# Patient Record
Sex: Female | Born: 2000 | Race: White | Hispanic: No | Marital: Single | Smoking: Never smoker
Health system: Southern US, Community
[De-identification: ages and names within clinical notes are randomized; demographics above are authoritative.]

## PROBLEM LIST (undated history)

## (undated) DIAGNOSIS — N809 Endometriosis, unspecified: Secondary | ICD-10-CM

## (undated) DIAGNOSIS — G43909 Migraine, unspecified, not intractable, without status migrainosus: Secondary | ICD-10-CM

## (undated) DIAGNOSIS — T7840XA Allergy, unspecified, initial encounter: Secondary | ICD-10-CM

## (undated) DIAGNOSIS — B009 Herpesviral infection, unspecified: Secondary | ICD-10-CM

## (undated) DIAGNOSIS — K219 Gastro-esophageal reflux disease without esophagitis: Secondary | ICD-10-CM

## (undated) DIAGNOSIS — E7800 Pure hypercholesterolemia, unspecified: Secondary | ICD-10-CM

## (undated) DIAGNOSIS — G901 Familial dysautonomia [Riley-Day]: Secondary | ICD-10-CM

## (undated) DIAGNOSIS — G8929 Other chronic pain: Secondary | ICD-10-CM

## (undated) HISTORY — DX: Migraine, unspecified, not intractable, without status migrainosus: G43.909

## (undated) HISTORY — DX: Familial dysautonomia (riley-day): G90.1

## (undated) HISTORY — DX: Endometriosis, unspecified: N80.9

## (undated) HISTORY — DX: Other chronic pain: G89.29

## (undated) HISTORY — DX: Pure hypercholesterolemia, unspecified: E78.00

## (undated) HISTORY — PX: OTHER SURGICAL HISTORY: SHX169

## (undated) HISTORY — DX: Allergy, unspecified, initial encounter: T78.40XA

## (undated) HISTORY — DX: Herpesviral infection, unspecified: B00.9

## (undated) NOTE — *Deleted (*Deleted)
HEMATOLOGY/ONCOLOGY CONSULTATION NOTE  Date of Service: 01/19/2020  Patient Care Team: Maryellen Pile, MD as PCP - General (Pediatrics)  CHIEF COMPLAINTS/PURPOSE OF CONSULTATION:  IDA  HISTORY OF PRESENTING ILLNESS:  Cassandra Ramos is a wonderful 83 y.o. female who has been referred to [place] by Dr. Nadene Rubins for evaluation and management of iron deficiency anemia. Pt is accompanied today by ***. The pt reports that she is doing well overall.   The pt reports ***   Of note prior to the patient's visit today, pt has had *** completed on *** with results revealing ***.   Most recent lab results (***) of CBC is as follows: all values are WNL except for ***.  On review of systems, pt reports *** and denies *** and any other symptoms.   On PMHx the pt reports ***. On Social Hx the pt reports *** On Family Hx the pt reports ***  A: -Discussed patient's most recent labs from ***, *** -***   MEDICAL HISTORY:  Past Medical History:  Diagnosis Date  . Allergy    grass and tree pollen  . Chronic pain   . Dysautonomia (HCC)   . Endometriosis    suspected based on symptoms, runs in family  . GERD (gastroesophageal reflux disease)   . High cholesterol   . Migraine     SURGICAL HISTORY: Past Surgical History:  Procedure Laterality Date  . adnoidectomy      SOCIAL HISTORY: Social History   Socioeconomic History  . Marital status: Single    Spouse name: Not on file  . Number of children: Not on file  . Years of education: Not on file  . Highest education level: High school graduate  Occupational History  . Not on file  Tobacco Use  . Smoking status: Never Smoker  . Smokeless tobacco: Never Used  Vaping Use  . Vaping Use: Never used  Substance and Sexual Activity  . Alcohol use: Never  . Drug use: Never  . Sexual activity: Yes    Birth control/protection: Pill, Condom  Other Topics Concern  . Not on file  Social History Narrative   LIves at home with mom, dad,  sister, brother   Right handed   Works at [employer]   Caffeine: large coke every day   Social Determinants of Corporate investment banker Strain:   . Difficulty of Paying Living Expenses: Not on file  Food Insecurity:   . Worried About Programme researcher, broadcasting/film/video in the Last Year: Not on file  . Ran Out of Food in the Last Year: Not on file  Transportation Needs:   . Lack of Transportation (Medical): Not on file  . Lack of Transportation (Non-Medical): Not on file  Physical Activity:   . Days of Exercise per Week: Not on file  . Minutes of Exercise per Session: Not on file  Stress:   . Feeling of Stress : Not on file  Social Connections:   . Frequency of Communication with Friends and Family: Not on file  . Frequency of Social Gatherings with Friends and Family: Not on file  . Attends Religious Services: Not on file  . Active Member of Clubs or Organizations: Not on file  . Attends Banker Meetings: Not on file  . Marital Status: Not on file  Intimate Partner Violence:   . Fear of Current or Ex-Partner: Not on file  . Emotionally Abused: Not on file  . Physically Abused: Not on file  .  Sexually Abused: Not on file    FAMILY HISTORY: Family History  Problem Relation Age of Onset  . Asthma Mother   . Endometriosis Mother   . Migraines Mother   . Hypertension Maternal Aunt   . Diabetes Maternal Aunt   . High Cholesterol Maternal Aunt   . Asthma Maternal Aunt   . Asthma Maternal Grandmother   . Hypertension Maternal Grandmother   . High Cholesterol Maternal Grandmother   . Heart disease Maternal Grandmother   . Cancer Maternal Grandmother        breast  . Lung cancer Paternal Grandmother     ALLERGIES:  is allergic to other.  MEDICATIONS:  Current Outpatient Medications  Medication Sig Dispense Refill  . DULoxetine (CYMBALTA) 60 MG capsule Take 60 mg by mouth daily.    . Fremanezumab-vfrm (AJOVY) 225 MG/1.5ML SOSY Inject 225 mg into the skin every 30  (thirty) days. 1.5 mL 11  . SPRINTEC 28 0.25-35 MG-MCG tablet Take 1 tablet by mouth daily.    . traZODone (DESYREL) 50 MG tablet Take 100 mg by mouth at bedtime.    Marland Kitchen Ubrogepant 100 MG TABS Take 100 mg by mouth every 2 (two) hours as needed. For migraine. Maximum 200mg (2 tablets) in one day. 10 tablet 6   No current facility-administered medications for this visit.    REVIEW OF SYSTEMS:    10 Point review of Systems was done is negative except as noted above.  PHYSICAL EXAMINATION: ECOG PERFORMANCE STATUS: {CHL ONC ECOG LK:[id number]}  .There were no vitals filed for this visit. There were no vitals filed for this visit. .There is no height or weight on file to calculate BMI.  *** GENERAL:alert, in no acute distress and comfortable SKIN: no acute rashes, no significant lesions EYES: conjunctiva are pink and non-injected, sclera anicteric OROPHARYNX: MMM, no exudates, no oropharyngeal erythema or ulceration NECK: supple, no JVD LYMPH:  no palpable lymphadenopathy in the cervical, axillary or inguinal regions LUNGS: clear to auscultation b/l with normal respiratory effort HEART: regular rate & rhythm ABDOMEN:  normoactive bowel sounds , non tender, not distended. Extremity: no pedal edema PSYCH: alert & oriented x 3 with fluent speech NEURO: no focal motor/sensory deficits  LABORATORY DATA:  I have reviewed the data as listed  . CBC Latest Ref Rng & Units 09/11/2018  WBC 3.4 - 10.8 x10E3/uL 8.3  Hemoglobin 11.1 - 15.9 g/dL 66.4  Hematocrit 40.3 - 46.6 % 37.1  Platelets 150 - 450 x10E3/uL 300    . CMP Latest Ref Rng & Units 09/11/2018  Glucose 65 - 99 mg/dL 97  BUN 6 - 20 mg/dL 11  Creatinine 4.74 - 2.59 mg/dL 5.63  Sodium 875 - 643 mmol/L 143  Potassium 3.5 - 5.2 mmol/L 5.3(H)  Chloride 96 - 106 mmol/L 106  CO2 20 - 29 mmol/L 20  Calcium 8.7 - 10.2 mg/dL 9.6  Total Protein 6.0 - 8.5 g/dL 6.8  Total Bilirubin 0.0 - 1.2 mg/dL <3.2  Alkaline Phos 43 - 101 IU/L 87  AST  0 - 40 IU/L 15  ALT 0 - 32 IU/L 13     RADIOGRAPHIC STUDIES: I have personally reviewed the radiological images as listed and agreed with the findings in the report. No results found.  ASSESSMENT & PLAN:   PLAN: ***   FOLLOW UP: ***  All of the patients questions were answered with apparent satisfaction. The patient knows to call the clinic with any problems, questions or concerns.  I spent ***  counseling the patient face to face. The total time spent in the appointment was *** and more than 50% was on counseling and direct patient cares.    Wyvonnia Lora MD MS AAHIVMS [facility] Hematology/Oncology Physician [place]  (Office):       [phone] (Work cell):  [phone] (Fax):           [phone]  01/19/2020 8:06 AM  I, Carollee Herter, am acting as a scribe for Dr. Wyvonnia Lora.   {Add Production assistant, radio Statement}

---
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
## 2020-01-06 ENCOUNTER — Telehealth: Payer: Self-pay | Admitting: Hematology

## 2020-01-06 NOTE — Telephone Encounter (Signed)
Received a new hem referral from Dr. Nadene Rubins at [facility] for IDA. Pt has been cld and scheduled to see Dr. Candise Che on 11/8 at 1pm. Appt date and time has been given to the pt's mom. Aware to arrive 30 minutes early.

## 2020-01-19 ENCOUNTER — Other Ambulatory Visit: Payer: Self-pay | Admitting: Gastroenterology

## 2020-01-19 ENCOUNTER — Inpatient Hospital Stay: Payer: BC Managed Care – PPO | Admitting: Hematology

## 2020-01-19 ENCOUNTER — Other Ambulatory Visit: Payer: Self-pay

## 2020-01-19 ENCOUNTER — Telehealth: Payer: Self-pay | Admitting: Hematology

## 2020-01-19 DIAGNOSIS — D509 Iron deficiency anemia, unspecified: Secondary | ICD-10-CM

## 2020-01-19 DIAGNOSIS — E569 Vitamin deficiency, unspecified: Secondary | ICD-10-CM

## 2020-01-19 NOTE — Telephone Encounter (Signed)
Rescheduled per 11/08 appointment to 11/16 per patient's request.

## 2020-01-23 ENCOUNTER — Ambulatory Visit
Admission: RE | Admit: 2020-01-23 | Discharge: 2020-01-23 | Disposition: A | Discharge status: Home / Self Care | Payer: BC Managed Care – PPO | Source: Ambulatory Visit | Attending: Gastroenterology | Admitting: Gastroenterology

## 2020-01-23 DIAGNOSIS — D509 Iron deficiency anemia, unspecified: Secondary | ICD-10-CM

## 2020-01-23 DIAGNOSIS — E569 Vitamin deficiency, unspecified: Secondary | ICD-10-CM

## 2020-01-27 ENCOUNTER — Encounter: Payer: BC Managed Care – PPO | Admitting: Hematology

## 2020-02-02 ENCOUNTER — Inpatient Hospital Stay: Payer: BC Managed Care – PPO

## 2020-02-02 ENCOUNTER — Inpatient Hospital Stay: Payer: BC Managed Care – PPO | Attending: Hematology | Admitting: Hematology

## 2020-02-02 ENCOUNTER — Other Ambulatory Visit: Payer: Self-pay

## 2020-02-02 VITALS — BP 102/51 | HR 90 | Temp 98.0°F | Resp 18 | Ht 64.0 in | Wt 201.5 lb

## 2020-02-02 DIAGNOSIS — E538 Deficiency of other specified B group vitamins: Secondary | ICD-10-CM

## 2020-02-02 DIAGNOSIS — Z842 Family history of other diseases of the genitourinary system: Secondary | ICD-10-CM | POA: Diagnosis not present

## 2020-02-02 DIAGNOSIS — E638 Other specified nutritional deficiencies: Secondary | ICD-10-CM | POA: Insufficient documentation

## 2020-02-02 DIAGNOSIS — K219 Gastro-esophageal reflux disease without esophagitis: Secondary | ICD-10-CM | POA: Insufficient documentation

## 2020-02-02 DIAGNOSIS — Z803 Family history of malignant neoplasm of breast: Secondary | ICD-10-CM | POA: Insufficient documentation

## 2020-02-02 DIAGNOSIS — Z801 Family history of malignant neoplasm of trachea, bronchus and lung: Secondary | ICD-10-CM | POA: Insufficient documentation

## 2020-02-02 DIAGNOSIS — E559 Vitamin D deficiency, unspecified: Secondary | ICD-10-CM | POA: Insufficient documentation

## 2020-02-02 DIAGNOSIS — G43909 Migraine, unspecified, not intractable, without status migrainosus: Secondary | ICD-10-CM | POA: Diagnosis not present

## 2020-02-02 DIAGNOSIS — Z8349 Family history of other endocrine, nutritional and metabolic diseases: Secondary | ICD-10-CM | POA: Diagnosis not present

## 2020-02-02 DIAGNOSIS — Z79899 Other long term (current) drug therapy: Secondary | ICD-10-CM | POA: Diagnosis not present

## 2020-02-02 DIAGNOSIS — D509 Iron deficiency anemia, unspecified: Secondary | ICD-10-CM | POA: Diagnosis present

## 2020-02-02 DIAGNOSIS — K580 Irritable bowel syndrome with diarrhea: Secondary | ICD-10-CM | POA: Diagnosis not present

## 2020-02-02 DIAGNOSIS — Z793 Long term (current) use of hormonal contraceptives: Secondary | ICD-10-CM | POA: Diagnosis not present

## 2020-02-02 LAB — FERRITIN: Ferritin: 7 ng/mL — ABNORMAL LOW (ref 11–307)

## 2020-02-02 LAB — CMP (CANCER CENTER ONLY)
ALT: 16 U/L (ref 0–44)
AST: 13 U/L — ABNORMAL LOW (ref 15–41)
Albumin: 3.8 g/dL (ref 3.5–5.0)
Alkaline Phosphatase: 97 U/L (ref 38–126)
Anion gap: 10 (ref 5–15)
BUN: 9 mg/dL (ref 6–20)
CO2: 25 mmol/L (ref 22–32)
Calcium: 9.6 mg/dL (ref 8.9–10.3)
Chloride: 102 mmol/L (ref 98–111)
Creatinine: 1.01 mg/dL — ABNORMAL HIGH (ref 0.44–1.00)
GFR, Estimated: >60 mL/min (ref >60)
Glucose, Bld: 100 mg/dL — ABNORMAL HIGH (ref 70–99)
Potassium: 3.9 mmol/L (ref 3.5–5.1)
Sodium: 137 mmol/L (ref 135–145)
Total Bilirubin: 0.4 mg/dL (ref 0.3–1.2)
Total Protein: 8.2 g/dL — ABNORMAL HIGH (ref 6.5–8.1)

## 2020-02-02 LAB — CBC WITH DIFFERENTIAL/PLATELET
Abs Immature Granulocytes: 0.04 10*3/uL (ref 0.00–0.07)
Basophils Absolute: 0 10*3/uL (ref 0.0–0.1)
Basophils Relative: 0 %
Eosinophils Absolute: 0 10*3/uL (ref 0.0–0.5)
Eosinophils Relative: 0 %
HCT: 41.3 % (ref 36.0–46.0)
Hemoglobin: 12.8 g/dL (ref 12.0–15.0)
Immature Granulocytes: 0 %
Lymphocytes Relative: 26 %
Lymphs Abs: 2.9 10*3/uL (ref 0.7–4.0)
MCH: 25.3 pg — ABNORMAL LOW (ref 26.0–34.0)
MCHC: 31 g/dL (ref 30.0–36.0)
MCV: 81.6 fL (ref 80.0–100.0)
Monocytes Absolute: 0.6 10*3/uL (ref 0.1–1.0)
Monocytes Relative: 6 %
Neutro Abs: 7.3 10*3/uL (ref 1.7–7.7)
Neutrophils Relative %: 68 %
Platelets: 339 10*3/uL (ref 150–400)
RBC: 5.06 MIL/uL (ref 3.87–5.11)
RDW: 13.7 % (ref 11.5–15.5)
WBC: 10.9 10*3/uL — ABNORMAL HIGH (ref 4.0–10.5)
nRBC: 0 % (ref 0.0–0.2)

## 2020-02-02 LAB — IRON AND TIBC
Iron: 31 ug/dL — ABNORMAL LOW (ref 41–142)
Saturation Ratios: 6 % — ABNORMAL LOW (ref 21–57)
TIBC: 514 ug/dL — ABNORMAL HIGH (ref 236–444)
UIBC: 483 ug/dL — ABNORMAL HIGH (ref 120–384)

## 2020-02-02 LAB — VITAMIN B12: Vitamin B-12: 730 pg/mL (ref 180–914)

## 2020-02-02 NOTE — Progress Notes (Signed)
HEMATOLOGY/ONCOLOGY CONSULTATION NOTE  Date of Service: 02/02/2020  Patient Care Team: Maryellen Pile, MD as PCP - General (Pediatrics)  CHIEF COMPLAINTS/PURPOSE OF CONSULTATION:  IDA  HISTORY OF PRESENTING ILLNESS:   Cassandra Ramos is a wonderful 19 y.o. female who has been referred to [place] by Dr. Dorinda Hill for evaluation and management of iron deficiency anemia. The pt reports that she is doing well overall.   The pt reports that she was previously Vitamin B12 deficient (@ 203). She is currently receiving monthly Vitamin B12 injections and her B12 levels have normalized. Pt was also discovered to be Vitamin D and Iron deficient by her OBGYN. She was placed on Vitamin D and OTC PO Iron a few months ago. Pt has had difficulty tolerating these nutritional supplements. Pt has tried a B-complex in the past, which was not tolerated well.   Pt had an Upper Endoscopy over 5 years ago for esophageal spasms and nothing of concern was found. Pt was recently seen by Dr. Ewing Schlein who diagnosed her with diarrhea predominant IBS. She also had a barium swallow study with Dr. Ewing Schlein. She was placed on Protonix two weeks ago. Pt eats lots of meats and cheeses but does not eat many fruits or vegetables. Pt has no food allergies or dietary restrictions. Pt does not believe that she has Celiac disease.   Pt was placed on hormonal birth control for menstrual cycle management at 12 as she has a family history of Endometriosis and PCOS. Her menstrual cycles last for about four days and are not especially heavy. Pt recently missed two periods with no explanation. Her mother has some form of thyroid dysfunction. This has been worked up and ruled out for the pt. Pt has noticed a significant decrease in energy levels and has gained 20 lbs in the last few months. Her migraines have been stable and she continues to follow with Neurology. Pt had Dysautonomia/Pots that was thought to be stable and is not currently being  monitored. She has a history of intolerance with SSRIs, beta-blockers, and Depo-Provera.  On review of systems, pt reports abdominal pain, fatigue, weight gain and denies fevers, chills, night sweats, rash and any other symptoms.   On PMHx the pt reports IBS, Acid reflux, Anemia, Migraines, Vitamin B12 deficiency, Vitamin D deficiency, Iron deficiency, POTS.  On Family Hx the pt reports that her mother has a thyroid disorder. She has other family history of Endometriosis and PCOS.   MEDICAL HISTORY:  Past Medical History:  Diagnosis Date  . Allergy    grass and tree pollen  . Chronic pain   . Dysautonomia (HCC)   . Endometriosis    suspected based on symptoms, runs in family  . GERD (gastroesophageal reflux disease)   . High cholesterol   . Migraine     SURGICAL HISTORY: Past Surgical History:  Procedure Laterality Date  . adnoidectomy      SOCIAL HISTORY: Social History   Socioeconomic History  . Marital status: Single    Spouse name: Not on file  . Number of children: Not on file  . Years of education: Not on file  . Highest education level: High school graduate  Occupational History  . Not on file  Tobacco Use  . Smoking status: Never Smoker  . Smokeless tobacco: Never Used  Vaping Use  . Vaping Use: Never used  Substance and Sexual Activity  . Alcohol use: Never  . Drug use: Never  . Sexual activity: Yes  Birth control/protection: Pill, Condom  Other Topics Concern  . Not on file  Social History Narrative   LIves at home with mom, dad, sister, brother   Right handed   Works at [employer]   Caffeine: large coke every day   Social Determinants of Corporate investment banker Strain:   . Difficulty of Paying Living Expenses: Not on file  Food Insecurity:   . Worried About Programme researcher, broadcasting/film/video in the Last Year: Not on file  . Ran Out of Food in the Last Year: Not on file  Transportation Needs:   . Lack of Transportation (Medical): Not on file  . Lack  of Transportation (Non-Medical): Not on file  Physical Activity:   . Days of Exercise per Week: Not on file  . Minutes of Exercise per Session: Not on file  Stress:   . Feeling of Stress : Not on file  Social Connections:   . Frequency of Communication with Friends and Family: Not on file  . Frequency of Social Gatherings with Friends and Family: Not on file  . Attends Religious Services: Not on file  . Active Member of Clubs or Organizations: Not on file  . Attends Banker Meetings: Not on file  . Marital Status: Not on file  Intimate Partner Violence:   . Fear of Current or Ex-Partner: Not on file  . Emotionally Abused: Not on file  . Physically Abused: Not on file  . Sexually Abused: Not on file    FAMILY HISTORY: Family History  Problem Relation Age of Onset  . Asthma Mother   . Endometriosis Mother   . Migraines Mother   . Hypertension Maternal Aunt   . Diabetes Maternal Aunt   . High Cholesterol Maternal Aunt   . Asthma Maternal Aunt   . Asthma Maternal Grandmother   . Hypertension Maternal Grandmother   . High Cholesterol Maternal Grandmother   . Heart disease Maternal Grandmother   . Cancer Maternal Grandmother        breast  . Lung cancer Paternal Grandmother     ALLERGIES:  is allergic to other.  MEDICATIONS:  Current Outpatient Medications  Medication Sig Dispense Refill  . DULoxetine (CYMBALTA) 60 MG capsule Take 60 mg by mouth daily.    . Fremanezumab-vfrm (AJOVY) 225 MG/1.5ML SOSY Inject 225 mg into the skin every 30 (thirty) days. 1.5 mL 11  . SPRINTEC 28 0.25-35 MG-MCG tablet Take 1 tablet by mouth daily.    . traZODone (DESYREL) 50 MG tablet Take 100 mg by mouth at bedtime.    Marland Kitchen Ubrogepant 100 MG TABS Take 100 mg by mouth every 2 (two) hours as needed. For migraine. Maximum 200mg (2 tablets) in one day. 10 tablet 6   No current facility-administered medications for this visit.    REVIEW OF SYSTEMS:   A 10+ POINT REVIEW OF SYSTEMS WAS  OBTAINED including neurology, dermatology, psychiatry, cardiac, respiratory, lymph, extremities, GI, GU, Musculoskeletal, constitutional, breasts, reproductive, HEENT.  All pertinent positives are noted in the HPI.  All others are negative.   PHYSICAL EXAMINATION: ECOG PERFORMANCE STATUS: 1 - Symptomatic but completely ambulatory  . Vitals:   02/02/20 1120  BP: (!) 102/51  Pulse: 90  Resp: 18  Temp: 98 F (36.7 C)  SpO2: 100%   Filed Weights   02/02/20 1120  Weight: 201 lb 8 oz (91.4 kg)   .Body mass index is 34.59 kg/m.  GENERAL:alert, in no acute distress and comfortable SKIN: no acute  rashes, no significant lesions EYES: conjunctiva are pink and non-injected, sclera anicteric OROPHARYNX: MMM, no exudates, no oropharyngeal erythema or ulceration NECK: supple, no JVD LYMPH:  no palpable lymphadenopathy in the cervical, axillary or inguinal regions LUNGS: clear to auscultation b/l with normal respiratory effort HEART: regular rate & rhythm ABDOMEN:  normoactive bowel sounds , non tender, not distended. No palpable hepatosplenomegaly.  Extremity: no pedal edema PSYCH: alert & oriented x 3 with fluent speech NEURO: no focal motor/sensory deficits  LABORATORY DATA:  I have reviewed the data as listed  . CBC Latest Ref Rng & Units 02/02/2020 02/02/2020 09/11/2018  WBC 4.0 - 10.5 K/uL 10.9(H) - 8.3  Hemoglobin 12.0 - 15.0 g/dL 29.512.8 - 62.111.8  Hematocrit 34.0 - 46.6 % 40.7 41.3 37.1  Platelets 150 - 400 K/uL 339 - 300    . CMP Latest Ref Rng & Units 02/02/2020 09/11/2018  Glucose 70 - 99 mg/dL 308(M100(H) 97  BUN 6 - 20 mg/dL 9 11  Creatinine 5.780.44 - 1.00 mg/dL 4.69(G1.01(H) 2.950.87  Sodium 284135 - 145 mmol/L 137 143  Potassium 3.5 - 5.1 mmol/L 3.9 5.3(H)  Chloride 98 - 111 mmol/L 102 106  CO2 22 - 32 mmol/L 25 20  Calcium 8.9 - 10.3 mg/dL 9.6 9.6  Total Protein 6.5 - 8.1 g/dL 8.2(H) 6.8  Total Bilirubin 0.3 - 1.2 mg/dL 0.4 <1.3<0.2  Alkaline Phos 38 - 126 U/L 97 87  AST 15 - 41 U/L 13(L) 15   ALT 0 - 44 U/L 16 13     RADIOGRAPHIC STUDIES: I have personally reviewed the radiological images as listed and agreed with the findings in the report. DG UGI W SMALL BOWEL  Result Date: 01/23/2020 CLINICAL DATA:  Iron deficiency anemia. EXAM: UPPER GI SERIES WITH SMALL BOWEL FOLLOW-THROUGH FLUOROSCOPY TIME:  Fluoroscopy Time:  2 minutes 36 second Radiation Exposure Index (if provided by the fluoroscopic device): 524 mGy Number of Acquired Spot Images: 0 TECHNIQUE: Combined double contrast and single contrast upper GI series using effervescent crystals, thick barium, and thin barium. Subsequently, serial images of the small bowel were obtained including spot views of the terminal ileum. COMPARISON:  None. FINDINGS: The esophagus is patent.  There is no stricture or mass identified. The stomach, duodenal bulb and duodenal C loop appear normal. No hiatal hernia or reflux noted. Small bowel transit was monitored with sequential radiographs of the abdomen. After 2 hours and 20 minutes contrast was observed within the colon. The small bowel fold pattern appears normal. No dilated loops of small bowel identified. Spot radiographs of the small bowel loops were noted. No abnormality identified. The terminal ileum was localized and appears unremarkable. IMPRESSION: 1. Upper limits of normal small bowel transit at 2 hours 20 minutes. 2. No focal abnormality identified within the upper GI tract or small bowel loops. Electronically Signed   By: Signa Kellaylor  Stroud M.D.   On: 01/23/2020 11:49    ASSESSMENT & PLAN:   19 yo with   1) Iron deficiency anemia likely due to menstrual losses but given multiple nutritional deficiencies concern for absorption issues. PLAN: -Advised pt that the most common cause of anemia for women her age would be excessive menstrual losses, but her periods seem stable.  -Advised pt that the multiple nutritional deficiencies that we are seeing suggests an issue with nutrient absorption  and/or diet.  -Advised pt that if Ferritin is low and she is unable to tolerate PO Iron well we would replace Iron IV.  -Advised pt  that the highest risk with IV Iron is the risk of allergic reactions.  -Advised pt that we would premedicate to prevent allergic reactions and monitor during IV Iron infusions. -Recommend pt increase fresh fruits and vegetables in diet.  -Recommend pt continue monthly Vitamin B12 injections.  -Will get labs today - will r/o pernicious anemia -Will give IV Injectafer weekly x2 if labs suggests   FOLLOW UP: Labs today IV Iron based on labs  . Orders Placed This Encounter  Procedures  . CBC with Differential/Platelet    Standing Status:   Future    Number of Occurrences:   1    Standing Expiration Date:   02/01/2021  . CMP (Cancer Center only)    Standing Status:   Future    Number of Occurrences:   1    Standing Expiration Date:   02/01/2021  . Ferritin    Standing Status:   Future    Number of Occurrences:   1    Standing Expiration Date:   02/01/2021  . Iron and TIBC    Standing Status:   Future    Number of Occurrences:   1    Standing Expiration Date:   02/01/2021  . Vitamin B12    Standing Status:   Future    Number of Occurrences:   1    Standing Expiration Date:   02/01/2021  . Folate RBC    Standing Status:   Future    Number of Occurrences:   1    Standing Expiration Date:   02/01/2021  . Anti-parietal antibody    Standing Status:   Future    Number of Occurrences:   1    Standing Expiration Date:   02/01/2021  . Intrinsic factor antibodies    Standing Status:   Future    Number of Occurrences:   1    Standing Expiration Date:   02/01/2021  . Celiac panel 10    Standing Status:   Future    Number of Occurrences:   1    Standing Expiration Date:   02/01/2021     All of the patients questions were answered with apparent satisfaction. The patient knows to call the clinic with any problems, questions or concerns.  I spent 35  mins counseling the patient face to face. The total time spent in the appointment was 45 minutes and more than 50% was on counseling and direct patient cares.    Wyvonnia Lora MD MS AAHIVMS [facility] Hematology/Oncology Physician [facility]  (Office):       [phone] (Work cell):  [phone] (Fax):           [phone]  02/02/2020 4:29 PM  I, Carollee Herter, am acting as a scribe for Dr. Wyvonnia Lora.   .I have reviewed the above documentation for accuracy and completeness, and I agree with the above. Johney Maine MD

## 2020-02-03 LAB — FOLATE RBC
Folate, Hemolysate: 327 ng/mL
Folate, RBC: 803 ng/mL (ref >498)
Hematocrit: 40.7 % (ref 34.0–46.6)

## 2020-02-03 LAB — INTRINSIC FACTOR ANTIBODIES: Intrinsic Factor: 1.1 AU/mL (ref 0.0–1.1)

## 2020-02-03 LAB — ANTI-PARIETAL ANTIBODY: Parietal Cell Antibody-IgG: 1.1 Units (ref 0.0–20.0)

## 2020-02-04 LAB — CELIAC PANEL 10
Antigliadin Abs, IgA: 5 units (ref 0–19)
Endomysial Ab, IgA: NEGATIVE
Gliadin IgG: 2 units (ref 0–19)
IgA: 197 mg/dL (ref 87–352)
Tissue Transglut Ab: <2 U/mL (ref 0–5)
Tissue Transglutaminase Ab, IgA: <2 U/mL (ref 0–3)

## 2020-02-09 ENCOUNTER — Telehealth: Payer: Self-pay | Admitting: Hematology

## 2020-02-09 ENCOUNTER — Telehealth: Payer: Self-pay | Admitting: *Deleted

## 2020-02-09 NOTE — Telephone Encounter (Signed)
Scheduled appt per 11/29 sch msg - pt Is aware of appt date and time

## 2020-02-09 NOTE — Telephone Encounter (Signed)
Contacted patient regarding test results per Dr. Clyda Greener directions in message below. Patient verbalized understanding. Schedule message sent to contact patient to schedule for lab and MD appt in 3 months.

## 2020-02-09 NOTE — Telephone Encounter (Signed)
-----   Message from Johney Maine, MD sent at 02/08/2020 11:30 PM EST ----- Plz let patient know she has no anemia hgb 12.8, but is significantly iron deficient ferritin 7. Lab do not suggest overt absorption issue. Would recommend Iron polysaccharide 150mg  po BID OTC and plz schedule for f/u with labs and MD visit in 3 months.

## 2020-02-17 ENCOUNTER — Telehealth: Payer: Self-pay | Admitting: *Deleted

## 2020-02-17 NOTE — Telephone Encounter (Signed)
Completed Bernita Raisin PA on Cover My Meds. Key: [id number]. [facility] [employer] approved immediately 02/17/2020 - 02/16/2021. I faxed the notice to the pharmacy. Received a receipt of confirmation.

## 2020-02-28 ENCOUNTER — Encounter: Payer: Self-pay | Admitting: Hematology

## 2020-03-01 ENCOUNTER — Other Ambulatory Visit: Payer: Self-pay | Admitting: Hematology

## 2020-03-01 DIAGNOSIS — D509 Iron deficiency anemia, unspecified: Secondary | ICD-10-CM | POA: Insufficient documentation

## 2020-03-01 DIAGNOSIS — D500 Iron deficiency anemia secondary to blood loss (chronic): Secondary | ICD-10-CM

## 2020-03-03 ENCOUNTER — Other Ambulatory Visit: Payer: Self-pay | Admitting: Hematology

## 2020-03-03 ENCOUNTER — Telehealth: Payer: Self-pay

## 2020-03-03 ENCOUNTER — Inpatient Hospital Stay: Payer: BC Managed Care – PPO | Attending: Hematology

## 2020-03-03 ENCOUNTER — Other Ambulatory Visit: Payer: Self-pay

## 2020-03-03 VITALS — BP 105/66 | HR 82 | Temp 98.5°F | Resp 20 | Wt 200.5 lb

## 2020-03-03 DIAGNOSIS — D500 Iron deficiency anemia secondary to blood loss (chronic): Secondary | ICD-10-CM

## 2020-03-03 DIAGNOSIS — D509 Iron deficiency anemia, unspecified: Secondary | ICD-10-CM | POA: Diagnosis not present

## 2020-03-03 MED ORDER — ACETAMINOPHEN 325 MG PO TABS
650.0000 mg | ORAL_TABLET | Freq: Once | ORAL | Status: AC
  Administered 2020-03-03: 12:00:00 650 mg via ORAL

## 2020-03-03 MED ORDER — SODIUM CHLORIDE 0.9 % IV SOLN
200.0000 mg | Freq: Once | INTRAVENOUS | Status: AC
  Administered 2020-03-03: 13:00:00 200 mg via INTRAVENOUS
  Filled 2020-03-03: qty 200

## 2020-03-03 MED ORDER — LORATADINE 10 MG PO TABS
ORAL_TABLET | ORAL | Status: AC
  Filled 2020-03-03: qty 1

## 2020-03-03 MED ORDER — ACETAMINOPHEN 325 MG PO TABS
ORAL_TABLET | ORAL | Status: AC
  Filled 2020-03-03: qty 2

## 2020-03-03 MED ORDER — LORATADINE 10 MG PO TABS
10.0000 mg | ORAL_TABLET | Freq: Once | ORAL | Status: AC
  Administered 2020-03-03: 12:00:00 10 mg via ORAL

## 2020-03-03 MED ORDER — SODIUM CHLORIDE 0.9 % IV SOLN
Freq: Once | INTRAVENOUS | Status: AC
  Filled 2020-03-03: qty 250

## 2020-03-03 NOTE — Telephone Encounter (Signed)
Per Dr. Candise Che, message sent to scheduling for Venofer twice weekly for 5 doses.

## 2020-03-03 NOTE — Progress Notes (Signed)
Pt. completed first dose Venofer without any issues and completed 30 minute post observation. Stable for discharge. Left via ambulation, no respiratory distress noted.

## 2020-03-03 NOTE — Patient Instructions (Signed)

## 2020-03-08 ENCOUNTER — Inpatient Hospital Stay: Payer: BC Managed Care – PPO

## 2020-03-08 ENCOUNTER — Other Ambulatory Visit: Payer: Self-pay

## 2020-03-08 VITALS — BP 105/58 | HR 93 | Temp 98.1°F | Resp 20

## 2020-03-08 DIAGNOSIS — D500 Iron deficiency anemia secondary to blood loss (chronic): Secondary | ICD-10-CM

## 2020-03-08 DIAGNOSIS — D509 Iron deficiency anemia, unspecified: Secondary | ICD-10-CM | POA: Diagnosis not present

## 2020-03-08 MED ORDER — LORATADINE 10 MG PO TABS
10.0000 mg | ORAL_TABLET | Freq: Once | ORAL | Status: AC
  Administered 2020-03-08: 15:00:00 10 mg via ORAL

## 2020-03-08 MED ORDER — ACETAMINOPHEN 325 MG PO TABS
ORAL_TABLET | ORAL | Status: AC
  Filled 2020-03-08: qty 2

## 2020-03-08 MED ORDER — SODIUM CHLORIDE 0.9 % IV SOLN
200.0000 mg | Freq: Once | INTRAVENOUS | Status: AC
  Administered 2020-03-08: 15:00:00 200 mg via INTRAVENOUS
  Filled 2020-03-08: qty 200

## 2020-03-08 MED ORDER — LORATADINE 10 MG PO TABS
ORAL_TABLET | ORAL | Status: AC
  Filled 2020-03-08: qty 1

## 2020-03-08 MED ORDER — SODIUM CHLORIDE 0.9 % IV SOLN
Freq: Once | INTRAVENOUS | Status: AC
  Filled 2020-03-08: qty 250

## 2020-03-08 MED ORDER — ACETAMINOPHEN 325 MG PO TABS
650.0000 mg | ORAL_TABLET | Freq: Once | ORAL | Status: AC
  Administered 2020-03-08: 15:00:00 650 mg via ORAL

## 2020-03-12 ENCOUNTER — Inpatient Hospital Stay: Payer: BC Managed Care – PPO

## 2020-03-12 ENCOUNTER — Other Ambulatory Visit: Payer: Self-pay

## 2020-03-12 VITALS — BP 112/65 | HR 93 | Temp 98.1°F | Resp 19

## 2020-03-12 DIAGNOSIS — D509 Iron deficiency anemia, unspecified: Secondary | ICD-10-CM | POA: Diagnosis not present

## 2020-03-12 DIAGNOSIS — D500 Iron deficiency anemia secondary to blood loss (chronic): Secondary | ICD-10-CM

## 2020-03-12 MED ORDER — ACETAMINOPHEN 325 MG PO TABS
ORAL_TABLET | ORAL | Status: AC
  Filled 2020-03-12: qty 2

## 2020-03-12 MED ORDER — SODIUM CHLORIDE 0.9 % IV SOLN
Freq: Once | INTRAVENOUS | Status: AC
  Filled 2020-03-12: qty 250

## 2020-03-12 MED ORDER — LORATADINE 10 MG PO TABS
10.0000 mg | ORAL_TABLET | Freq: Once | ORAL | Status: AC
  Administered 2020-03-12: 10 mg via ORAL

## 2020-03-12 MED ORDER — ACETAMINOPHEN 325 MG PO TABS
650.0000 mg | ORAL_TABLET | Freq: Once | ORAL | Status: AC
  Administered 2020-03-12: 650 mg via ORAL

## 2020-03-12 MED ORDER — SODIUM CHLORIDE 0.9 % IV SOLN
200.0000 mg | Freq: Once | INTRAVENOUS | Status: AC
  Administered 2020-03-12: 200 mg via INTRAVENOUS
  Filled 2020-03-12: qty 200

## 2020-03-12 MED ORDER — LORATADINE 10 MG PO TABS
ORAL_TABLET | ORAL | Status: AC
  Filled 2020-03-12: qty 1

## 2020-03-15 ENCOUNTER — Inpatient Hospital Stay: Payer: BC Managed Care – PPO | Attending: Hematology

## 2020-03-15 ENCOUNTER — Other Ambulatory Visit: Payer: Self-pay

## 2020-03-15 VITALS — BP 125/82 | HR 74 | Temp 98.4°F | Resp 18 | Ht 64.0 in | Wt 205.5 lb

## 2020-03-15 DIAGNOSIS — D500 Iron deficiency anemia secondary to blood loss (chronic): Secondary | ICD-10-CM

## 2020-03-15 DIAGNOSIS — D509 Iron deficiency anemia, unspecified: Secondary | ICD-10-CM | POA: Diagnosis not present

## 2020-03-15 MED ORDER — LORATADINE 10 MG PO TABS
ORAL_TABLET | ORAL | Status: AC
  Filled 2020-03-15: qty 1

## 2020-03-15 MED ORDER — ACETAMINOPHEN 325 MG PO TABS
ORAL_TABLET | ORAL | Status: AC
  Filled 2020-03-15: qty 2

## 2020-03-15 MED ORDER — SODIUM CHLORIDE 0.9 % IV SOLN
Freq: Once | INTRAVENOUS | Status: AC
  Filled 2020-03-15: qty 250

## 2020-03-15 MED ORDER — LORATADINE 10 MG PO TABS
10.0000 mg | ORAL_TABLET | Freq: Once | ORAL | Status: AC
Start: 2020-03-15 — End: 2020-03-15
  Administered 2020-03-15: 10 mg via ORAL

## 2020-03-15 MED ORDER — IRON SUCROSE 20 MG/ML IV SOLN
200.0000 mg | Freq: Once | INTRAVENOUS | Status: AC
  Administered 2020-03-15: 200 mg via INTRAVENOUS
  Filled 2020-03-15: qty 200

## 2020-03-15 MED ORDER — ACETAMINOPHEN 325 MG PO TABS
650.0000 mg | ORAL_TABLET | Freq: Once | ORAL | Status: AC
  Administered 2020-03-15: 650 mg via ORAL

## 2020-03-15 NOTE — Patient Instructions (Signed)

## 2020-03-15 NOTE — Progress Notes (Signed)
Pt post 30 min observation, vital signs are within defined limits pt is stable and ambulatory at discharge to lobby. Pt declined AVS

## 2020-03-19 ENCOUNTER — Other Ambulatory Visit: Payer: Self-pay

## 2020-03-19 ENCOUNTER — Inpatient Hospital Stay: Payer: BC Managed Care – PPO

## 2020-03-19 VITALS — BP 106/69 | HR 81 | Temp 98.4°F | Resp 18

## 2020-03-19 DIAGNOSIS — E538 Deficiency of other specified B group vitamins: Secondary | ICD-10-CM

## 2020-03-19 DIAGNOSIS — D509 Iron deficiency anemia, unspecified: Secondary | ICD-10-CM | POA: Diagnosis not present

## 2020-03-19 DIAGNOSIS — D500 Iron deficiency anemia secondary to blood loss (chronic): Secondary | ICD-10-CM

## 2020-03-19 MED ORDER — SODIUM CHLORIDE 0.9 % IV SOLN
200.0000 mg | Freq: Once | INTRAVENOUS | Status: AC
  Administered 2020-03-19: 200 mg via INTRAVENOUS
  Filled 2020-03-19: qty 200

## 2020-03-19 MED ORDER — LORATADINE 10 MG PO TABS
10.0000 mg | ORAL_TABLET | Freq: Once | ORAL | Status: AC
  Administered 2020-03-19: 10 mg via ORAL

## 2020-03-19 MED ORDER — ACETAMINOPHEN 325 MG PO TABS
650.0000 mg | ORAL_TABLET | Freq: Once | ORAL | Status: AC
  Administered 2020-03-19: 650 mg via ORAL

## 2020-03-19 MED ORDER — LORATADINE 10 MG PO TABS
ORAL_TABLET | ORAL | Status: AC
  Filled 2020-03-19: qty 1

## 2020-03-19 MED ORDER — SODIUM CHLORIDE 0.9 % IV SOLN
Freq: Once | INTRAVENOUS | Status: AC
  Filled 2020-03-19: qty 250

## 2020-03-19 MED ORDER — ACETAMINOPHEN 325 MG PO TABS
ORAL_TABLET | ORAL | Status: AC
  Filled 2020-03-19: qty 2

## 2020-03-19 NOTE — Patient Instructions (Signed)

## 2020-04-14 ENCOUNTER — Telehealth: Payer: Self-pay | Admitting: Allergy and Immunology

## 2020-04-14 NOTE — Telephone Encounter (Signed)
Informed and appointment scheduled.

## 2020-04-14 NOTE — Telephone Encounter (Signed)
Left message to call office

## 2020-04-14 NOTE — Telephone Encounter (Signed)
Please inform C[place]ra that we cannot write that letter legally as we have not seen her in the past year.  Medicolegal we need to see people every year to provide them medical care or advice.

## 2020-04-14 NOTE — Telephone Encounter (Signed)
Patient called and requested a letter stating she is allergic to materials in medical masks. Patient's job is switching from cloth masks to medical masks which she breaks out, itches, and has a running nose after wearing the medical mask. Patient has not been seen since 10/2015 with Dr. Lucie Leather.  Please advise.

## 2020-04-30 ENCOUNTER — Other Ambulatory Visit: Payer: Self-pay | Admitting: *Deleted

## 2020-04-30 DIAGNOSIS — E538 Deficiency of other specified B group vitamins: Secondary | ICD-10-CM

## 2020-04-30 DIAGNOSIS — D500 Iron deficiency anemia secondary to blood loss (chronic): Secondary | ICD-10-CM

## 2020-05-02 NOTE — Progress Notes (Signed)
HEMATOLOGY/ONCOLOGY CONSULTATION NOTE  Date of Service: 05/02/2020  Patient Care Team: Maryellen Pileubin, David, MD as PCP - General (Pediatrics)  CHIEF COMPLAINTS/PURPOSE OF CONSULTATION:  IDA  HISTORY OF PRESENTING ILLNESS:   Cassandra Ramos is a wonderful 20 y.o. female who has been referred to us by Dr. Dorinda HillLaura Wile for evaluation and management of iron deficiency anemia. The pt reports that she is doing well overall.   The pt reports that she was previously Vitamin B12 deficient (@ 203). She is currently receiving monthly Vitamin B12 injections and her B12 levels have normalized. Pt was also discovered to be Vitamin D and Iron deficient by her OBGYN. She was placed on Vitamin D and OTC PO Iron a few months ago. Pt has had difficulty tolerating these nutritional supplements. Pt has tried a B-complex in the past, which was not tolerated well.   Pt had an Upper Endoscopy over 5 years ago for esophageal spasms and nothing of concern was found. Pt was recently seen by Dr. Ewing SchleinMagod who diagnosed her with diarrhea predominant IBS. She also had a barium swallow study with Dr. Ewing SchleinMagod. She was placed on Protonix two weeks ago. Pt eats lots of meats and cheeses but does not eat many fruits or vegetables. Pt has no food allergies or dietary restrictions. Pt does not believe that she has Celiac disease.   Pt was placed on hormonal birth control for menstrual cycle management at 12 as she has a family history of Endometriosis and PCOS. Her menstrual cycles last for about four days and are not especially heavy. Pt recently missed two periods with no explanation. Her mother has some form of thyroid dysfunction. This has been worked up and ruled out for the pt. Pt has noticed a significant decrease in energy levels and has gained 20 lbs in the last few months. Her migraines have been stable and she continues to follow with Neurology. Pt had Dysautonomia/Pots that was thought to be stable and is not currently being  monitored. She has a history of intolerance with SSRIs, beta-blockers, and Depo-Provera.  On review of systems, pt reports abdominal pain, fatigue, weight gain and denies fevers, chills, night sweats, rash and any other symptoms.   On PMHx the pt reports IBS, Acid reflux, Anemia, Migraines, Vitamin B12 deficiency, Vitamin D deficiency, Iron deficiency, POTS.  On Family Hx the pt reports that her mother has a thyroid disorder. She has other family history of Endometriosis and PCOS.   INTERVAL HISTORY  Cassandra Ramos is a wonderful 20 y.o. female who is here today for evaluation and management of iron deficiency anemia. The patient's last visit with us was on 02/02/2020.   The pt reports her fatigue is a little better but she still has a decent amount of fatigue.  Tolerated IV iron well with no other acute issues.  Her hemoglobin is improved from 11.8 in 09/11/2018 up to 13.5 on 05/04/2020.  Her ferritin has improved from 7 to 115. B12 levels are within normal limits at 454.  She notes that her periods have been regular.  On review of systems, pt reports no other issues with bleeding.Marland Kitchen.   MEDICAL HISTORY:  Past Medical History:  Diagnosis Date  . Allergy    grass and tree pollen  . Chronic pain   . Dysautonomia (HCC)   . Endometriosis    suspected based on symptoms, runs in family  . GERD (gastroesophageal reflux disease)   . High cholesterol   . Migraine  SURGICAL HISTORY: Past Surgical History:  Procedure Laterality Date  . adnoidectomy      SOCIAL HISTORY: Social History   Socioeconomic History  . Marital status: Single    Spouse name: Not on file  . Number of children: Not on file  . Years of education: Not on file  . Highest education level: High school graduate  Occupational History  . Not on file  Tobacco Use  . Smoking status: Never Smoker  . Smokeless tobacco: Never Used  Vaping Use  . Vaping Use: Never used  Substance and Sexual Activity  . Alcohol use:  Never  . Drug use: Never  . Sexual activity: Yes    Birth control/protection: Pill, Condom  Other Topics Concern  . Not on file  Social History Narrative   LIves at home with mom, dad, sister, brother   Right handed   Works at [employer]   Caffeine: large coke every day   Social Determinants of Corporate investment banker Strain: Not on [employer] Insecurity: Not on file  Transportation Needs: Not on file  Physical Activity: Not on file  Stress: Not on file  Social Connections: Not on file  Intimate Partner Violence: Not on file    FAMILY HISTORY: Family History  Problem Relation Age of Onset  . Asthma Mother   . Endometriosis Mother   . Migraines Mother   . Hypertension Maternal Aunt   . Diabetes Maternal Aunt   . High Cholesterol Maternal Aunt   . Asthma Maternal Aunt   . Asthma Maternal Grandmother   . Hypertension Maternal Grandmother   . High Cholesterol Maternal Grandmother   . Heart disease Maternal Grandmother   . Cancer Maternal Grandmother        breast  . Lung cancer Paternal Grandmother     ALLERGIES:  is allergic to other.  MEDICATIONS:  Current Outpatient Medications  Medication Sig Dispense Refill  . DULoxetine (CYMBALTA) 60 MG capsule Take 60 mg by mouth daily.    . Fremanezumab-vfrm (AJOVY) 225 MG/1.5ML SOSY Inject 225 mg into the skin every 30 (thirty) days. 1.5 mL 11  . SPRINTEC 28 0.25-35 MG-MCG tablet Take 1 tablet by mouth daily.    . traZODone (DESYREL) 50 MG tablet Take 100 mg by mouth at bedtime.    Marland Kitchen Ubrogepant 100 MG TABS Take 100 mg by mouth every 2 (two) hours as needed. For migraine. Maximum 200mg (2 tablets) in one day. 10 tablet 6   No current facility-administered medications for this visit.    REVIEW OF SYSTEMS:   10 Point review of Systems was done is negative except as noted above  PHYSICAL EXAMINATION: ECOG PERFORMANCE STATUS: 1 - Symptomatic but completely ambulatory  . Vitals:   05/04/20 0849  BP: 125/81  Pulse:  94  Resp: 18  Temp: 97.8 F (36.6 C)  SpO2: 100%   Filed Weights   05/04/20 0849  Weight: 202 lb 8 oz (91.9 kg)   .Body mass index is 34.76 kg/m.  NAD GENERAL:alert, in no acute distress and comfortable SKIN: no acute rashes, no significant lesions EYES: conjunctiva are pink and non-injected, sclera anicteric OROPHARYNX: MMM, no exudates, no oropharyngeal erythema or ulceration NECK: supple, no JVD LYMPH:  no palpable lymphadenopathy in the cervical, axillary or inguinal regions LUNGS: clear to auscultation b/l with normal respiratory effort HEART: regular rate & rhythm ABDOMEN:  normoactive bowel sounds , non tender, not distended. Extremity: no pedal edema PSYCH: alert & oriented x 3  with fluent speech NEURO: no focal motor/sensory deficits  LABORATORY DATA:  I have reviewed the data as listed  . CBC Latest Ref Rng & Units 05/04/2020 02/02/2020 02/02/2020  WBC 4.0 - 10.5 K/uL 7.5 10.9(H) -  Hemoglobin 12.0 - 15.0 g/dL 62.6 94.8 -  Hematocrit 36.0 - 46.0 % 42.4 40.7 41.3  Platelets 150 - 400 K/uL 251 339 -    . CMP Latest Ref Rng & Units 05/04/2020 02/02/2020 09/11/2018  Glucose 70 - 99 mg/dL 546(E) 703(J) 97  BUN 6 - 20 mg/dL 10 9 11   Creatinine 0.44 - 1.00 mg/dL 0.09) 3.81(W  Sodium 135 - 145 mmol/L 137 137 143  Potassium 3.5 - 5.1 mmol/L 4.0 3.9 5.3(H)  Chloride 98 - 111 mmol/L 106 102 106  CO2 22 - 32 mmol/L 25 25 20   Calcium 8.9 - 10.3 mg/dL 9.2 9.6 9.6  Total Protein 6.5 - 8.1 g/dL 7.4 2.99) 6.8  Total Bilirubin 0.3 - 1.2 mg/dL 0.3 0.4  Alkaline Phos 38 - 126 U/L 73 97 87  AST 15 - 41 U/L 13(L) 13(L) 15  ALT 0 - 44 U/L 18 16 13    . Lab Results  Component Value Date   IRON 54 05/04/2020   TIBC 338 05/04/2020   IRONPCTSAT 16 (L) 05/04/2020   (Iron and TIBC)  Lab Results  Component Value Date   FERRITIN 115 05/04/2020   B12 454   RADIOGRAPHIC STUDIES: I have personally reviewed the radiological images as listed and agreed with the  findings in the report. No results found.  ASSESSMENT & PLAN:   20 yo with   1) Iron deficiency anemia likely due to menstrual losses but given multiple nutritional deficiencies concern for absorption issues.  PLAN: -Discussed pt labwork today, 05/03/2020; discussed in detail with the patient. Her CBC with blood counts are completely normal.  No anemia with a hemoglobin of 13.5. Patient tolerated IV iron well with no acute issues.  Ferritin levels are significantly improved at 115 up from 7. Patient notes mild improvement in energy levels but still has fatigue from other medical considerations for which she was recommended to continue follow-up with her primary care physician. -Recommend pt continue monthly Vitamin B12 injections with her primary care physician.  Levels are appropriate today.. -No indication for additional IV iron at this time.  FOLLOW UP: Return to clinic with Dr. 05/06/2020 with labs in 6 months.  If her labs remain stable at the time we could discharge her back to her primary care physician.  . Orders Placed This Encounter  Procedures  . CBC with Differential/Platelet    Standing Status:   Future    Standing Expiration Date:   05/04/2021  . CMP (Cancer Center only)    Standing Status:   Future    Standing Expiration Date:   05/04/2021  . Vitamin B12    Standing Status:   Future    Standing Expiration Date:   05/04/2021  . Ferritin    Standing Status:   Future    Standing Expiration Date:   05/04/2021  . Iron and TIBC    Standing Status:   Future    Standing Expiration Date:   05/04/2021    All of the patients questions were answered with apparent satisfaction. The patient knows to call the clinic with any problems, questions or concerns.   The total time spent in the appointment was 20 minutes and more than 50% was on counseling and direct patient cares.  Wyvonnia Lora MD MS AAHIVMS [facility] Hematology/Oncology Physician [facility]  (Office):        [phone] (Work cell):  [phone] (Fax):           [phone]  05/02/2020 9:48 AM  I, Minda Meo, am acting as scribe for Dr. Wyvonnia Lora, MD.    .I have reviewed the above documentation for accuracy and completeness, and I agree with the above. Johney Maine MD

## 2020-05-04 ENCOUNTER — Other Ambulatory Visit: Payer: Self-pay

## 2020-05-04 ENCOUNTER — Inpatient Hospital Stay: Payer: BC Managed Care – PPO

## 2020-05-04 ENCOUNTER — Inpatient Hospital Stay: Payer: BC Managed Care – PPO | Attending: Hematology | Admitting: Hematology

## 2020-05-04 ENCOUNTER — Ambulatory Visit (INDEPENDENT_AMBULATORY_CARE_PROVIDER_SITE_OTHER): Payer: BC Managed Care – PPO | Admitting: Allergy and Immunology

## 2020-05-04 ENCOUNTER — Telehealth: Payer: Self-pay | Admitting: Hematology

## 2020-05-04 VITALS — BP 116/60 | HR 89 | Temp 97.8°F | Resp 16 | Ht 64.0 in | Wt 202.4 lb

## 2020-05-04 VITALS — BP 125/81 | HR 94 | Temp 97.8°F | Resp 18 | Ht 64.0 in | Wt 202.5 lb

## 2020-05-04 DIAGNOSIS — Z803 Family history of malignant neoplasm of breast: Secondary | ICD-10-CM | POA: Diagnosis not present

## 2020-05-04 DIAGNOSIS — E7800 Pure hypercholesterolemia, unspecified: Secondary | ICD-10-CM | POA: Diagnosis not present

## 2020-05-04 DIAGNOSIS — E538 Deficiency of other specified B group vitamins: Secondary | ICD-10-CM

## 2020-05-04 DIAGNOSIS — Z801 Family history of malignant neoplasm of trachea, bronchus and lung: Secondary | ICD-10-CM | POA: Diagnosis not present

## 2020-05-04 DIAGNOSIS — Z8249 Family history of ischemic heart disease and other diseases of the circulatory system: Secondary | ICD-10-CM | POA: Insufficient documentation

## 2020-05-04 DIAGNOSIS — Z8349 Family history of other endocrine, nutritional and metabolic diseases: Secondary | ICD-10-CM | POA: Insufficient documentation

## 2020-05-04 DIAGNOSIS — K580 Irritable bowel syndrome with diarrhea: Secondary | ICD-10-CM | POA: Insufficient documentation

## 2020-05-04 DIAGNOSIS — E559 Vitamin D deficiency, unspecified: Secondary | ICD-10-CM | POA: Diagnosis not present

## 2020-05-04 DIAGNOSIS — Z833 Family history of diabetes mellitus: Secondary | ICD-10-CM | POA: Insufficient documentation

## 2020-05-04 DIAGNOSIS — K219 Gastro-esophageal reflux disease without esophagitis: Secondary | ICD-10-CM | POA: Diagnosis not present

## 2020-05-04 DIAGNOSIS — L2489 Irritant contact dermatitis due to other agents: Secondary | ICD-10-CM

## 2020-05-04 DIAGNOSIS — D509 Iron deficiency anemia, unspecified: Secondary | ICD-10-CM | POA: Insufficient documentation

## 2020-05-04 DIAGNOSIS — Z79899 Other long term (current) drug therapy: Secondary | ICD-10-CM | POA: Diagnosis not present

## 2020-05-04 DIAGNOSIS — D500 Iron deficiency anemia secondary to blood loss (chronic): Secondary | ICD-10-CM

## 2020-05-04 LAB — CBC WITH DIFFERENTIAL (CANCER CENTER ONLY)
Abs Immature Granulocytes: 0.02 10*3/uL (ref 0.00–0.07)
Basophils Absolute: 0 10*3/uL (ref 0.0–0.1)
Basophils Relative: 0 %
Eosinophils Absolute: 0 10*3/uL (ref 0.0–0.5)
Eosinophils Relative: 0 %
HCT: 42.4 % (ref 36.0–46.0)
Hemoglobin: 13.5 g/dL (ref 12.0–15.0)
Immature Granulocytes: 0 %
Lymphocytes Relative: 25 %
Lymphs Abs: 1.8 10*3/uL (ref 0.7–4.0)
MCH: 27.5 pg (ref 26.0–34.0)
MCHC: 31.8 g/dL (ref 30.0–36.0)
MCV: 86.4 fL (ref 80.0–100.0)
Monocytes Absolute: 0.5 10*3/uL (ref 0.1–1.0)
Monocytes Relative: 7 %
Neutro Abs: 5.1 10*3/uL (ref 1.7–7.7)
Neutrophils Relative %: 68 %
Platelet Count: 251 10*3/uL (ref 150–400)
RBC: 4.91 MIL/uL (ref 3.87–5.11)
RDW: 14.7 % (ref 11.5–15.5)
WBC Count: 7.5 10*3/uL (ref 4.0–10.5)
nRBC: 0 % (ref 0.0–0.2)

## 2020-05-04 LAB — CMP (CANCER CENTER ONLY)
ALT: 18 U/L (ref 0–44)
AST: 13 U/L — ABNORMAL LOW (ref 15–41)
Albumin: 3.7 g/dL (ref 3.5–5.0)
Alkaline Phosphatase: 73 U/L (ref 38–126)
Anion gap: 6 (ref 5–15)
BUN: 10 mg/dL (ref 6–20)
CO2: 25 mmol/L (ref 22–32)
Calcium: 9.2 mg/dL (ref 8.9–10.3)
Chloride: 106 mmol/L (ref 98–111)
Creatinine: 0.93 mg/dL (ref 0.44–1.00)
GFR, Estimated: >60 mL/min (ref >60)
Glucose, Bld: 111 mg/dL — ABNORMAL HIGH (ref 70–99)
Potassium: 4 mmol/L (ref 3.5–5.1)
Sodium: 137 mmol/L (ref 135–145)
Total Bilirubin: 0.3 mg/dL (ref 0.3–1.2)
Total Protein: 7.4 g/dL (ref 6.5–8.1)

## 2020-05-04 LAB — VITAMIN B12: Vitamin B-12: 454 pg/mL (ref 180–914)

## 2020-05-04 LAB — FERRITIN: Ferritin: 115 ng/mL (ref 11–307)

## 2020-05-04 LAB — IRON AND TIBC
Iron: 54 ug/dL (ref 41–142)
Saturation Ratios: 16 % — ABNORMAL LOW (ref 21–57)
TIBC: 338 ug/dL (ref 236–444)
UIBC: 284 ug/dL (ref 120–384)

## 2020-05-04 NOTE — Progress Notes (Signed)
[place] [facility] [place] - [place] - [place] - [place]   Follow-up Note  Referring Provider: Maryellen Pile, MD Primary Provider: Melida Quitter, MD Date of Office Visit: 05/04/2020  Subjective:   Cassandra Ramos (DOB: 09/16/2000) is a 20 y.o. female who [facility] in re-evaluation of the following:  HPI: Shaelin presents to this clinic in evaluation of problems she is having with surgical masks.  She works for [employer] and is mandated that she uses a surgical mask.  When doing so she has constant runny nose and irritated eyes and her skin gets red at areas of contact and she develops cystic lesions at these contact areas.  She can wear a cloth mask with no problem.  She was seen in this clinic many years ago for an issue with allergic rhinoconjunctivitis and reflux and hyperventilation syndrome and possible component of asthma.  All of these issues are not particularly a big issue for her at this point in time and she does not really require any type of evaluation or treatment nor does she desire any evaluation or treatment for these issues.  She has received 3 COVID vaccinations.  Allergies as of 05/04/2020      Reactions   Other    Allergic to an ingredient in certain Deoderants and something that is in Cetaphil face wash.       Medication List      Adderall XR 20 MG 24 hr capsule Generic drug: amphetamine-dextroamphetamine Take 20 mg by mouth every morning.   Ajovy 225 MG/1.5ML Sosy Generic drug: Fremanezumab-vfrm Inject 225 mg into the skin every 30 (thirty) days.   busPIRone 10 MG tablet Commonly known as: BUSPAR Take 10 mg by mouth 2 (two) times daily.   cetirizine 10 MG tablet Commonly known as: ZYRTEC Take 10 mg by mouth daily.   cyanocobalamin 1000 MCG/ML injection Commonly known as: (VITAMIN B-12) Inject 1,000 mcg into the muscle every 30 (thirty) days.   DULoxetine 60 MG capsule Commonly known as:  CYMBALTA Take 60 mg by mouth daily.   Hyoscyamine Sulfate SL 0.125 MG Subl   IBgard 90 MG Cpcr Generic drug: Peppermint Oil Take 1 capsule by mouth daily.   mometasone 50 MCG/ACT nasal spray Commonly known as: NASONEX Place 2 sprays into the nose daily.   pantoprazole 40 MG tablet Commonly known as: PROTONIX Take 40 mg by mouth daily.   Sprintec 28 0.25-35 MG-MCG tablet Generic drug: norgestimate-ethinyl estradiol Take 1 tablet by mouth daily.   traZODone 50 MG tablet Commonly known as: DESYREL Take 100 mg by mouth at bedtime.   Ubrogepant 100 MG Tabs Take 100 mg by mouth every 2 (two) hours as needed. For migraine. Maximum 200mg (2 tablets) in one day.   Vitamin D 50 MCG (2000 UT) tablet Take 2,000 Units by mouth daily.       Past Medical History:  Diagnosis Date  . Allergy    grass and tree pollen  . Chronic pain   . Dysautonomia (HCC)   . Endometriosis    suspected based on symptoms, runs in family  . GERD (gastroesophageal reflux disease)   . High cholesterol   . Migraine     Past Surgical History:  Procedure Laterality Date  . adnoidectomy      Review of systems negative except as noted in HPI / PMHx or noted below:  Review of Systems  Constitutional: Negative.   HENT: Negative.   Eyes: Negative.  Respiratory: Negative.   Cardiovascular: Negative.   Gastrointestinal: Negative.   Genitourinary: Negative.   Musculoskeletal: Negative.   Skin: Negative.   Neurological: Negative.   Endo/Heme/Allergies: Negative.   Psychiatric/Behavioral: Negative.      Objective:   Vitals:   05/04/20 1019  BP: 116/60  Pulse: 89  Resp: 16  Temp: 97.8 F (36.6 C)  SpO2: 98%   Height: 5\' 4"  (162.6 cm)  Weight: 202 lb 6.4 oz (91.8 kg)   Physical Exam Constitutional:      Appearance: She is not diaphoretic.  HENT:     Head: Normocephalic.     Right Ear: Tympanic membrane, ear canal and external ear normal.     Left Ear: Tympanic membrane, ear canal  and external ear normal.     Nose: Nose normal. No mucosal edema or rhinorrhea.     Mouth/Throat:     Mouth: Oropharynx is clear and moist and mucous membranes are normal.     Pharynx: Uvula midline. No oropharyngeal exudate.  Eyes:     Conjunctiva/sclera: Conjunctivae normal.  Neck:     Thyroid: No thyromegaly.     Trachea: Trachea normal. No tracheal tenderness or tracheal deviation.  Cardiovascular:     Rate and Rhythm: Normal rate and regular rhythm.     Heart sounds: Normal heart sounds, S1 normal and S2 normal. No murmur heard.   Pulmonary:     Effort: No respiratory distress.     Breath sounds: Normal breath sounds. No stridor. No wheezing or rales.  Musculoskeletal:        General: No edema.  Lymphadenopathy:     Head:     Right side of head: No tonsillar adenopathy.     Left side of head: No tonsillar adenopathy.     Cervical: No cervical adenopathy.  Skin:    Findings: No erythema or rash.     Nails: There is no clubbing.  Neurological:     Mental Status: She is alert.     Diagnostics: none  Assessment and Plan:   1. Irritant contact dermatitis due to other agents     I have written Amantha a letter for her employer allowing her to use a cloth mask at work instead of her surgical mask.  I think that this is the easiest way to approach this issue.  Certainly if her employer does not accept this letter then we may need to work through this issue in more detail.  , MD Allergy / Immunology [facility]

## 2020-05-04 NOTE — Telephone Encounter (Signed)
Scheduled appointments per 2/21 los. Spoke to patient who is aware of appointments date and times.  

## 2020-05-05 ENCOUNTER — Encounter: Payer: Self-pay | Admitting: Allergy and Immunology

## 2020-11-01 ENCOUNTER — Inpatient Hospital Stay: Payer: BC Managed Care – PPO | Admitting: Hematology

## 2020-11-01 ENCOUNTER — Inpatient Hospital Stay: Payer: BC Managed Care – PPO | Attending: Pediatrics

## 2020-11-10 ENCOUNTER — Encounter (HOSPITAL_BASED_OUTPATIENT_CLINIC_OR_DEPARTMENT_OTHER): Payer: Self-pay | Admitting: Internal Medicine

## 2020-11-10 ENCOUNTER — Ambulatory Visit (INDEPENDENT_AMBULATORY_CARE_PROVIDER_SITE_OTHER): Payer: BC Managed Care – PPO | Admitting: Internal Medicine

## 2020-11-10 ENCOUNTER — Other Ambulatory Visit: Payer: Self-pay

## 2020-11-10 VITALS — BP 110/68 | HR 82 | Ht 64.0 in | Wt 198.0 lb

## 2020-11-10 DIAGNOSIS — G901 Familial dysautonomia [Riley-Day]: Secondary | ICD-10-CM | POA: Diagnosis not present

## 2020-11-10 DIAGNOSIS — R002 Palpitations: Secondary | ICD-10-CM | POA: Diagnosis not present

## 2020-11-10 DIAGNOSIS — E782 Mixed hyperlipidemia: Secondary | ICD-10-CM | POA: Diagnosis not present

## 2020-11-10 NOTE — Patient Instructions (Signed)
Medication Instructions:  NO CHANGES   Follow-Up: At [facility], you and your health needs are our priority.  As part of our continuing mission to provide you with exceptional heart care, we have created designated Provider Care Teams.  These Care Teams include your primary Cardiologist (physician) and Advanced Practice Providers (APPs -  Physician Assistants and Nurse Practitioners) who all work together to provide you with the care you need, when you need it.  We recommend signing up for the patient portal called "MyChart".  Sign up information is provided on this After Visit Summary.  MyChart is used to connect with patients for Virtual Visits (Telemedicine).  Patients are able to view lab/test results, encounter notes, upcoming appointments, etc.  Non-urgent messages can be sent to your provider as well.   To learn more about what you can do with MyChart, go to [email or web address].    Your next appointment:   AS NEEDED with Dr. Rennis Golden
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
## 2020-12-17 ENCOUNTER — Telehealth: Payer: BC Managed Care – PPO | Admitting: Emergency Medicine

## 2020-12-17 ENCOUNTER — Telehealth: Payer: Self-pay | Admitting: Internal Medicine

## 2020-12-17 DIAGNOSIS — Z91199 Patient's noncompliance with other medical treatment and regimen due to unspecified reason: Secondary | ICD-10-CM

## 2020-12-17 NOTE — Progress Notes (Signed)
The patient no-showed for appointment despite this provider sending direct link x 2 to both numbers listed on file, reaching out via phone with no response and waiting for at least 10 minutes from appointment time for patient to join. They will be marked as a NS for this appointment/time.

## 2020-12-17 NOTE — Telephone Encounter (Signed)
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

## 2020-12-17 NOTE — Telephone Encounter (Signed)
Patient c/o Palpitations:  High priority if patient c/o lightheadedness, shortness of breath, or chest pain  How long have you had palpitations/irregular HR/ Afib? Are you having the symptoms now? yes  Are you currently experiencing lightheadedness, SOB or CP? Lightheaded, a little chest, and some SOB  Do you have a history of afib (atrial fibrillation) or irregular heart rhythm? yes  Have you checked your BP or HR? (document readings if available): HR 80-160 in a couple seconds  Are you experiencing any other symptoms? No   Patient states she thinks she accidentally triggered her irregular HR by drinking caffeine and needs a doctors note to leave work. She states she cannot leave work without a doctor's note since it is the first 90 days. She says she does feel lightheaded like she may pass out, some SOB, and a little chest pain.

## 2020-12-27 ENCOUNTER — Telehealth: Payer: BC Managed Care – PPO | Admitting: Physician Assistant

## 2020-12-27 ENCOUNTER — Telehealth: Payer: BC Managed Care – PPO | Admitting: Family Medicine

## 2020-12-27 DIAGNOSIS — G43009 Migraine without aura, not intractable, without status migrainosus: Secondary | ICD-10-CM

## 2020-12-27 DIAGNOSIS — R112 Nausea with vomiting, unspecified: Secondary | ICD-10-CM

## 2020-12-27 NOTE — Patient Instructions (Signed)
Seline Rumer, thank you for joining Margaretann Loveless, PA-C for today's virtual visit.  While this provider is not your primary care provider (PCP), if your PCP is located in our provider database this encounter information will be shared with them immediately following your visit.  Consent: (Patient) Cassandra Ramos provided verbal consent for this virtual visit at the beginning of the encounter.  Current Medications:  Current Outpatient Medications:    ADDERALL XR 20 MG 24 hr capsule, Take 20 mg by mouth every morning. (Patient not taking: Reported on 11/10/2020), Disp: , Rfl:    busPIRone (BUSPAR) 10 MG tablet, Take 10 mg by mouth 2 (two) times daily. (Patient not taking: Reported on 11/10/2020), Disp: , Rfl:    cetirizine (ZYRTEC) 10 MG tablet, Take 10 mg by mouth daily. (Patient not taking: Reported on 11/10/2020), Disp: , Rfl:    Cholecalciferol (VITAMIN D) 50 MCG (2000 UT) tablet, Take 2,000 Units by mouth daily. (Patient not taking: Reported on 11/10/2020), Disp: , Rfl:    cyanocobalamin (,VITAMIN B-12,) 1000 MCG/ML injection, Inject 1,000 mcg into the muscle every 30 (thirty) days. (Patient not taking: Reported on 11/10/2020), Disp: , Rfl:    DULoxetine (CYMBALTA) 60 MG capsule, Take 60 mg by mouth daily. (Patient not taking: Reported on 11/10/2020), Disp: , Rfl:    Fremanezumab-vfrm (AJOVY) 225 MG/1.5ML SOSY, Inject 225 mg into the skin every 30 (thirty) days. (Patient not taking: No sig reported), Disp: 1.5 mL, Rfl: 11   Hyoscyamine Sulfate SL 0.125 MG SUBL, , Disp: , Rfl:    mometasone (NASONEX) 50 MCG/ACT nasal spray, Place 2 sprays into the nose daily. (Patient not taking: Reported on 11/10/2020), Disp: , Rfl:    pantoprazole (PROTONIX) 40 MG tablet, Take 40 mg by mouth daily. (Patient not taking: Reported on 11/10/2020), Disp: , Rfl:    Peppermint Oil (IBGARD) 90 MG CPCR, Take 1 capsule by mouth daily. (Patient not taking: Reported on 11/10/2020), Disp: , Rfl:    SPRINTEC 28 0.25-35  MG-MCG tablet, Take 1 tablet by mouth daily., Disp: , Rfl:    traZODone (DESYREL) 50 MG tablet, Take 100 mg by mouth at bedtime. (Patient not taking: Reported on 11/10/2020), Disp: , Rfl:    Ubrogepant 100 MG TABS, Take 100 mg by mouth every 2 (two) hours as needed. For migraine. Maximum 200mg (2 tablets) in one day. (Patient not taking: Reported on 11/10/2020), Disp: 10 tablet, Rfl: 6   Medications ordered in this encounter:  No orders of the defined types were placed in this encounter.    *If you need refills on other medications prior to your next appointment, please contact your pharmacy*  Follow-Up: Call back or seek an in-person evaluation if the symptoms worsen or if the condition fails to improve as anticipated.  Other Instructions Migraine Headache A migraine headache is a very strong throbbing pain on one side or both sides of your head. This type of headache can also cause other symptoms. It can last from 4 hours to 3 days. Talk with your doctor about what things may bring on (trigger) this condition. What are the causes? The exact cause of this condition is not known. This condition may be triggered or caused by: Drinking alcohol. Smoking. Taking medicines, such as: Medicine used to treat chest pain (nitroglycerin). Birth control pills. Estrogen. Some blood pressure medicines. Eating or drinking certain products. Doing physical activity. Other things that may trigger a migraine headache include: Having a menstrual period. Pregnancy. Hunger. Stress. Not getting enough sleep  or getting too much sleep. Weather changes. Tiredness (fatigue). What increases the risk? Being 26-63 years old. Being female. Having a family history of migraine headaches. Being Caucasian. Having depression or anxiety. Being very overweight. What are the signs or symptoms? A throbbing pain. This pain may: Happen in any area of the head, such as on one side or both sides. Make it hard to do  daily activities. Get worse with physical activity. Get worse around bright lights or loud noises. Other symptoms may include: Feeling sick to your stomach (nauseous). Vomiting. Dizziness. Being sensitive to bright lights, loud noises, or smells. Before you get a migraine headache, you may get warning signs (an aura). An aura may include: Seeing flashing lights or having blind spots. Seeing bright spots, halos, or zigzag lines. Having tunnel vision or blurred vision. Having numbness or a tingling feeling. Having trouble talking. Having weak muscles. Some people have symptoms after a migraine headache (postdromal phase), such as: Tiredness. Trouble thinking (concentrating). How is this treated? Taking medicines that: Relieve pain. Relieve the feeling of being sick to your stomach. Prevent migraine headaches. Treatment may also include: Having acupuncture. Avoiding foods that bring on migraine headaches. Learning ways to control your body functions (biofeedback). Therapy to help you know and deal with negative thoughts (cognitive behavioral therapy). Follow these instructions at home: Medicines Take over-the-counter and prescription medicines only as told by your doctor. Ask your doctor if the medicine prescribed to you: Requires you to avoid driving or using heavy machinery. Can cause trouble pooping (constipation). You may need to take these steps to prevent or treat trouble pooping: Drink enough fluid to keep your pee (urine) pale yellow. Take over-the-counter or prescription medicines. Eat foods that are high in fiber. These include beans, whole grains, and fresh fruits and vegetables. Limit foods that are high in fat and sugar. These include fried or sweet foods. Lifestyle Do not drink alcohol. Do not use any products that contain nicotine or tobacco, such as cigarettes, e-cigarettes, and chewing tobacco. If you need help quitting, ask your doctor. Get at least 8 hours of  sleep every night. Limit and deal with stress. General instructions   Keep a journal to find out what may bring on your migraine headaches. For example, write down: What you eat and drink. How much sleep you get. Any change in what you eat or drink. Any change in your medicines. If you have a migraine headache: Avoid things that make your symptoms worse, such as bright lights. It may help to lie down in a dark, quiet room. Do not drive or use heavy machinery. Ask your doctor what activities are safe for you. Keep all follow-up visits as told by your doctor. This is important. Contact a doctor if: You get a migraine headache that is different or worse than others you have had. You have more than 15 headache days in one month. Get help right away if: Your migraine headache gets very bad. Your migraine headache lasts longer than 72 hours. You have a fever. You have a stiff neck. You have trouble seeing. Your muscles feel weak or like you cannot control them. You start to lose your balance a lot. You start to have trouble walking. You pass out (faint). You have a seizure. Summary A migraine headache is a very strong throbbing pain on one side or both sides of your head. These headaches can also cause other symptoms. This condition may be treated with medicines and changes to your lifestyle.  Keep a journal to find out what may bring on your migraine headaches. Contact a doctor if you get a migraine headache that is different or worse than others you have had. Contact your doctor if you have more than 15 headache days in a month. This information is not intended to replace advice given to you by your health care provider. Make sure you discuss any questions you have with your health care provider. Document Revised: 06/21/2018 Document Reviewed: 04/11/2018 Elsevier Patient Education  2022 [employer].    If you have been instructed to have an in-person evaluation today at a local  Urgent Care facility, please use the link below. It will take you to a list of all of our available [facility], including address, phone number and hours of operation. Please do not delay care.  [facility]  If you or a family member do not have a primary care provider, use the link below to schedule a visit and establish care. When you choose a [facility] primary care physician or advanced practice provider, you gain a long-term partner in health. Find a Primary Care Provider  Learn more about [place]'s in-office and virtual care options: [facility] Get Care Now

## 2020-12-27 NOTE — Progress Notes (Signed)
Virtual Visit Consent   Cassandra Ramos, you are scheduled for a virtual visit with a [place] provider today.     Just as with appointments in the office, your consent must be obtained to participate.  Your consent will be active for this visit and any virtual visit you may have with one of our providers in the next 365 days.     If you have a MyChart account, a copy of this consent can be sent to you electronically.  All virtual visits are billed to your insurance company just like a traditional visit in the office.    As this is a virtual visit, video technology does not allow for your provider to perform a traditional examination.  This may limit your provider's ability to fully assess your condition.  If your provider identifies any concerns that need to be evaluated in person or the need to arrange testing (such as labs, EKG, etc.), we will make arrangements to do so.     Although advances in technology are sophisticated, we cannot ensure that it will always work on either your end or our end.  If the connection with a video visit is poor, the visit may have to be switched to a telephone visit.  With either a video or telephone visit, we are not always able to ensure that we have a secure connection.     I need to obtain your verbal consent now.   Are you willing to proceed with your visit today?    Cassandra Ramos has provided verbal consent on 12/27/2020 for a virtual visit (video or telephone).   Cassandra Loveless, PA-C   Date: 12/27/2020 1:22 PM   Virtual Visit via Video Note   IMargaretann Ramos, connected with  Cassandra Ramos  ([id number], [birth year]) on 12/27/20 at  1:15 PM EDT by a video-enabled telemedicine application and verified that I am speaking with the correct person using two identifiers.  Location: Patient: Virtual Visit Location Patient: Home Provider: Virtual Visit Location Provider: Home Office   I discussed the limitations of evaluation and management  by telemedicine and the availability of in person appointments. The patient expressed understanding and agreed to proceed.    History of Present Illness: Cassandra Ramos is a 20 y.o. who identifies as a female who was assigned female at birth, and is being seen today for migraine. Started last night. Having associated nausea. Has history of migraines and has abortive medications and medications for associated nausea, but these make her drowsy. She reports some improvement in symptoms with medications. Had to miss work today due to medication side effects and needs a work note.    Problems:  Patient Active Problem List   Diagnosis Date Noted   Iron deficiency anemia 03/01/2020   Other chronic pain 07/16/2019   Chronic migraine without aura without status migrainosus, not intractable 09/11/2018    Allergies:  Allergies  Allergen Reactions   Other     Allergic to an ingredient in certain Deoderants and something that is in Cetaphil face wash.    Medications:  Current Outpatient Medications:    ADDERALL XR 20 MG 24 hr capsule, Take 20 mg by mouth every morning. (Patient not taking: Reported on 11/10/2020), Disp: , Rfl:    busPIRone (BUSPAR) 10 MG tablet, Take 10 mg by mouth 2 (two) times daily. (Patient not taking: Reported on 11/10/2020), Disp: , Rfl:    cetirizine (ZYRTEC) 10 MG tablet, Take 10 mg by mouth daily. (Patient  not taking: Reported on 11/10/2020), Disp: , Rfl:    Cholecalciferol (VITAMIN D) 50 MCG (2000 UT) tablet, Take 2,000 Units by mouth daily. (Patient not taking: Reported on 11/10/2020), Disp: , Rfl:    cyanocobalamin (,VITAMIN B-12,) 1000 MCG/ML injection, Inject 1,000 mcg into the muscle every 30 (thirty) days. (Patient not taking: Reported on 11/10/2020), Disp: , Rfl:    DULoxetine (CYMBALTA) 60 MG capsule, Take 60 mg by mouth daily. (Patient not taking: Reported on 11/10/2020), Disp: , Rfl:    Fremanezumab-vfrm (AJOVY) 225 MG/1.5ML SOSY, Inject 225 mg into the skin every 30  (thirty) days. (Patient not taking: No sig reported), Disp: 1.5 mL, Rfl: 11   Hyoscyamine Sulfate SL 0.125 MG SUBL, , Disp: , Rfl:    mometasone (NASONEX) 50 MCG/ACT nasal spray, Place 2 sprays into the nose daily. (Patient not taking: Reported on 11/10/2020), Disp: , Rfl:    pantoprazole (PROTONIX) 40 MG tablet, Take 40 mg by mouth daily. (Patient not taking: Reported on 11/10/2020), Disp: , Rfl:    Peppermint Oil (IBGARD) 90 MG CPCR, Take 1 capsule by mouth daily. (Patient not taking: Reported on 11/10/2020), Disp: , Rfl:    SPRINTEC 28 0.25-35 MG-MCG tablet, Take 1 tablet by mouth daily., Disp: , Rfl:    traZODone (DESYREL) 50 MG tablet, Take 100 mg by mouth at bedtime. (Patient not taking: Reported on 11/10/2020), Disp: , Rfl:    Ubrogepant 100 MG TABS, Take 100 mg by mouth every 2 (two) hours as needed. For migraine. Maximum 200mg (2 tablets) in one day. (Patient not taking: Reported on 11/10/2020), Disp: 10 tablet, Rfl: 6  Observations/Objective: Patient is well-developed, well-nourished in no acute distress.  Resting comfortably at home.  Head is normocephalic, atraumatic.  No labored breathing.  Speech is clear and coherent with logical content.  Patient is alert and oriented at baseline.    Assessment and Plan: 1. Migraine without aura and without status migrainosus, not intractable  - Improving with her home medications but just having drowsiness side effect and required to miss work - Push fluids - Work note provided - Seek in person evaluation if symptoms continue to worsen or fail to improve  Follow Up Instructions: I discussed the assessment and treatment plan with the patient. The patient was provided an opportunity to ask questions and all were answered. The patient agreed with the plan and demonstrated an understanding of the instructions.  A copy of instructions were sent to the patient via MyChart unless otherwise noted below.    The patient was advised to call back or seek  an in-person evaluation if the symptoms worsen or if the condition fails to improve as anticipated.  Time:  I spent 7 minutes with the patient via telehealth technology discussing the above problems/concerns.    11/12/2020, PA-C

## 2020-12-27 NOTE — Progress Notes (Signed)
Error- did video visit   [place]

## 2020-12-28 ENCOUNTER — Encounter: Payer: Self-pay | Admitting: Physician Assistant

## 2021-02-10 ENCOUNTER — Encounter: Payer: Self-pay | Admitting: Hematology

## 2021-03-16 ENCOUNTER — Encounter: Payer: Self-pay | Admitting: Hematology

## 2021-03-24 ENCOUNTER — Encounter: Payer: Self-pay | Admitting: Hematology

## 2021-04-18 ENCOUNTER — Telehealth: Payer: Self-pay | Admitting: Neurology

## 2021-04-18 NOTE — Telephone Encounter (Signed)
Sent mychart message that last seen 07-16-2019, needs appt to evaluate/assess.

## 2021-04-18 NOTE — Telephone Encounter (Signed)
Spoke with Cassandra Ramos on the phone, we got her scheduled for a f/u for migraines on March 29. She was asking if she can be worked in for a video visit or telephone visit sooner, she is just needing a letter stating that she has a history of migraines so that her work is aware of her medical history in case she does need to take a day off for a migraine. She stated that the medications that Dr Lucia Gaskins put her on back in 2021 are now being managed by her psychiatrist, which is why she has not been back for a follow up.  Can you please advise if we can work the patient into a sooner video visit spot, or if a letter can be written for her advising of hx of migraines?  Thanks!

## 2021-04-19 ENCOUNTER — Telehealth: Payer: BC Managed Care – PPO | Admitting: Physician Assistant

## 2021-04-19 DIAGNOSIS — G43709 Chronic migraine without aura, not intractable, without status migrainosus: Secondary | ICD-10-CM

## 2021-04-19 NOTE — Progress Notes (Signed)
Patient requesting paperwork filled out for work accommodation for chronic migraines as required by her new place of employment. No current illness. Reached out to her Neurologist but had to be scheduled for an appointment as she had not been seen in > 1 year. Appointment is not for 3 months and work is wanting now. Informed her we do not complete FMLA or ongoing work accommodations so she will have to reach out to her PCP for this until Neurology can/will take over.

## 2021-04-20 NOTE — Telephone Encounter (Signed)
Spoke with pt and offered opening 2/8 at 8:30 patient stated due to her job she can only do appointments latest in the day and latest in the week. I let her know I did not have anything else available in that time frame before her currently scheduled appointment. Stated she would give [place] a call back before the end of the day today to let us know if she could do it after she spoke with her Freight forwarder.

## 2021-04-20 NOTE — Telephone Encounter (Signed)
ok 

## 2021-04-21 ENCOUNTER — Telehealth: Payer: Self-pay | Admitting: Neurology

## 2021-04-21 ENCOUNTER — Ambulatory Visit (INDEPENDENT_AMBULATORY_CARE_PROVIDER_SITE_OTHER): Payer: BC Managed Care – PPO | Admitting: Adult Health

## 2021-04-21 ENCOUNTER — Encounter: Payer: Self-pay | Admitting: Adult Health

## 2021-04-21 DIAGNOSIS — G43709 Chronic migraine without aura, not intractable, without status migrainosus: Secondary | ICD-10-CM

## 2021-04-21 MED ORDER — UBROGEPANT 100 MG PO TABS: 100.0000 mg | ORAL_TABLET | ORAL | 11 refills | Status: DC | PRN

## 2021-04-21 NOTE — Progress Notes (Signed)
PATIENT: Cassandra Ramos DOB: May 30, 2000  REASON FOR VISIT: follow up HISTORY FROM: patient PRIMARY NEUROLOGIST: Dr. Jaynee Eagles  HISTORY OF PRESENT ILLNESS: Today 04/21/21:  Cassandra Ramos is a 21 year old female with a history of migraine headaches.  She states that her headaches have been under relatively good control.  She has approximately 1 headache every 1 to 2 months.  Roselyn Meier with Aleve typically resolves her headaches fairly quickly.  She is asking for a work note today to excuse her from work when she has a migraine.  HISTORY 07/16/2019: She is feeling so much better, she found a great counselor for her pain, She saw a rheumatologist and did not have a good experience, a doctor told her she had fibromyalgia and was started on Cymbalta by psychiatry and she feels great. She is taking Ajovy and doing well. She stopped prozac. She is doing great on 60mg . Seeing a therapist who is very good. Her therapist is Multimedia programmer.Psych Dr. Cephus Shelling. She is going to try stopping Ajovy. Roselyn Meier works Engineer, manufacturing.     01/13/2019: Rizatriptan did not work, she had the burning sensation in the back of the head.  Also causes chest tightness and neck pain. Roselyn Meier works. She is on Proxac. It was giving her appetite suppression but not anymore. 15 headache days a month, 10-12 are severe or moderately severe and migrainous, can last 4 hours - 24 hours usually 4 hours with treatment.      HPI:  Cassandra Ramos is a 21 y.o. female here as requested by Karleen Dolphin, MD for headaches and migraines.  Mother is here and provides information. Past medical history of headaches seen in neurology, headache started 2013.  Past medical history of headaches and migraines, dysautonomia, chronic pain syndrome. FHx of hemiplegia migraines. She started getting headaches in puberty, started 1-2x a week. This past December she started getting massive headaches feeling like a nerve in the back of her neck. She felt her neck  was squeezing and forward radiation behind her eye. Severe, she wants to pass out, severe and acute, she will go to the floor. This is new (but similar symptoms in previous notes), this is new, ataxia, acts like she is drunk, she is on the floor, her "eyes roll back". She denies any hemiplegic migraines.In the past she had daily headaches and thinks she was drinking too much caffeine. She does not have daily headaches. Since January, even longer, mother says she has light sensitivity she does not like lights she likes the lights off, she has headaches with pulsating and pounding, no phonophobia, mild nausea, she also has chronic nausea and GI problems. 15 headache days a month, 10-12 are severe or moderately severe and migrainous, can last 4 hours - 24 hours usually 4 hours with treatment, using a headache icepack helps. Movement makes it worse. No inciting events but has a family history, worse at night. Patient emphatically denies any aura after discussion of what an aura is. Headaches are worse with positiion and valsalva, she has had vision changes episodically, she has vertigo.    meds tried: nortriptyline (side effects), propranolol (side effects low pulse), magnesium ineffective, B2 ineffective, Zofran, pedal Dolex, Imitrex(chest tightness).  They declined Effexor and Topamax due to side effects and other family members, also tried steroids   Reviewed notes, labs and imaging from outside physicians, which showed:    Patient was seen several times by neurology Dr. Gurney Maxin.  Headaches for 4 years initially seen August 23, 2015, worsening in the prior 4 months she has missed 30 days of school, head heart and stomach "issues".  History of dysautonomia.  Patient's mother and sister have history of headaches.  Headaches are daily with severe headaches occurring once a week, severe headaches are behind the left eye inside of the neck and in the apex of the head.  Pain is sharp, throbbing, dull, achy,  pressure.  No known trigger.  Has not tried any medications.  Positive for photophobia and nausea.  Physical neurological exam normal.  She was diagnosed with tension headaches and migraines.  She declined several other prescription medications options but mother declined stating side effects and family members to each choice.   She was seen for chronic pain syndrome, they discussed relationship between pain sleep food and exercise.    REVIEW OF SYSTEMS: Out of a complete 14 system review of symptoms, the patient complains only of the following symptoms, and all other reviewed systems are negative.  ALLERGIES: Allergies  Allergen Reactions   Other     Allergic to an ingredient in certain Deoderants and something that is in Cetaphil face wash.     HOME MEDICATIONS: Outpatient Medications Prior to Visit  Medication Sig Dispense Refill   ADDERALL XR 20 MG 24 hr capsule Take 20 mg by mouth every morning. (Patient not taking: Reported on 11/10/2020)     busPIRone (BUSPAR) 10 MG tablet Take 10 mg by mouth 2 (two) times daily. (Patient not taking: Reported on 11/10/2020)     cetirizine (ZYRTEC) 10 MG tablet Take 10 mg by mouth daily. (Patient not taking: Reported on 11/10/2020)     Cholecalciferol (VITAMIN D) 50 MCG (2000 UT) tablet Take 2,000 Units by mouth daily. (Patient not taking: Reported on 11/10/2020)     cyanocobalamin (,VITAMIN B-12,) 1000 MCG/ML injection Inject 1,000 mcg into the muscle every 30 (thirty) days. (Patient not taking: Reported on 11/10/2020)     DULoxetine (CYMBALTA) 60 MG capsule Take 60 mg by mouth daily. (Patient not taking: Reported on 11/10/2020)     Fremanezumab-vfrm (AJOVY) 225 MG/1.5ML SOSY Inject 225 mg into the skin every 30 (thirty) days. (Patient not taking: No sig reported) 1.5 mL 11   Hyoscyamine Sulfate SL 0.125 MG SUBL  (Patient not taking: Reported on 11/10/2020)     mometasone (NASONEX) 50 MCG/ACT nasal spray Place 2 sprays into the nose daily. (Patient not  taking: Reported on 11/10/2020)     pantoprazole (PROTONIX) 40 MG tablet Take 40 mg by mouth daily. (Patient not taking: Reported on 11/10/2020)     Peppermint Oil (IBGARD) 90 MG CPCR Take 1 capsule by mouth daily. (Patient not taking: Reported on 11/10/2020)     SPRINTEC 28 0.25-35 MG-MCG tablet Take 1 tablet by mouth daily.     traZODone (DESYREL) 50 MG tablet Take 100 mg by mouth at bedtime. (Patient not taking: Reported on 11/10/2020)     Ubrogepant 100 MG TABS Take 100 mg by mouth every 2 (two) hours as needed. For migraine. Maximum 200mg (2 tablets) in one day. (Patient not taking: Reported on 11/10/2020) 10 tablet 6   No facility-administered medications prior to visit.    PAST MEDICAL HISTORY: Past Medical History:  Diagnosis Date   Allergy    grass and tree pollen   Chronic pain    Dysautonomia (HCC)    Endometriosis    suspected based on symptoms, runs in family   GERD (gastroesophageal reflux disease)    High cholesterol  Migraine     PAST SURGICAL HISTORY: Past Surgical History:  Procedure Laterality Date   adnoidectomy      FAMILY HISTORY: Family History  Problem Relation Age of Onset   Asthma Mother    Endometriosis Mother    Migraines Mother    Hypertension Maternal Aunt    Diabetes Maternal Aunt    High Cholesterol Maternal Aunt    Asthma Maternal Aunt    Asthma Maternal Grandmother    Hypertension Maternal Grandmother    High Cholesterol Maternal Grandmother    Heart disease Maternal Grandmother    Cancer Maternal Grandmother        breast   Lung cancer Paternal Grandmother     SOCIAL HISTORY: Social History   Socioeconomic History   Marital status: Single    Spouse name: Not on file   Number of children: Not on file   Years of education: Not on file   Highest education level: High school graduate  Occupational History   Not on file  Tobacco Use   Smoking status: Never   Smokeless tobacco: Never  Vaping Use   Vaping Use: Never used   Substance and Sexual Activity   Alcohol use: Never   Drug use: Never   Sexual activity: Yes    Birth control/protection: Pill, Condom  Other Topics Concern   Not on file  Social History Narrative   LIves at home with mom, dad, sister, brother   Right handed   Works at [employer]   Caffeine: large coke every day   Social Determinants of Radio broadcast assistant Strain: Not on file  Food Insecurity: Not on file  Transportation Needs: Not on file  Physical Activity: Not on file  Stress: Not on file  Social Connections: Not on file  Intimate Partner Violence: Not on file      PHYSICAL EXAM  Vitals:   04/21/21 0818  BP: 107/68  Pulse: 78  Weight: 195 lb 6.4 oz (88.6 kg)  Height: 5\' 4"  (1.626 m)   Body mass index is 33.54 kg/m.  Generalized: Well developed, in no acute distress   Neurological examination  Mentation: Alert oriented to time, place, history taking. Follows all commands speech and language fluent Cranial nerve II-XII: Pupils were equal round reactive to light. Extraocular movements were full, visual field were full on confrontational test. Facial sensation and strength were normal. Uvula tongue midline. Head turning and shoulder shrug  were normal and symmetric. Motor: The motor testing reveals 5 over 5 strength of all 4 extremities. Good symmetric motor tone is noted throughout.  Sensory: Sensory testing is intact to soft touch on all 4 extremities. No evidence of extinction is noted.  Coordination: Cerebellar testing reveals good finger-nose-finger and heel-to-shin bilaterally.  Gait and station: Gait is normal.    DIAGNOSTIC DATA (LABS, IMAGING, TESTING) - I reviewed patient records, labs, notes, testing and imaging myself where available.  Lab Results  Component Value Date   WBC 7.5 05/04/2020   HGB 13.5 05/04/2020   HCT 42.4 05/04/2020   MCV 86.4 05/04/2020   PLT 251 05/04/2020      Component Value Date/Time   NA 137 05/04/2020 0837    NA 143 09/11/2018 0855   K 4.0 05/04/2020 0837   CL 106 05/04/2020 0837   CO2 25 05/04/2020 0837   GLUCOSE 111 (H) 05/04/2020 0837   BUN 10 05/04/2020 0837   BUN 11 09/11/2018 0855   CREATININE 0.93 05/04/2020 0837   CALCIUM  9.2 05/04/2020 0837   PROT 7.4 05/04/2020 0837   PROT 6.8 09/11/2018 0855   ALBUMIN 3.7 05/04/2020 0837   ALBUMIN 4.4 09/11/2018 0855   AST 13 (L) 05/04/2020 0837   ALT 18 05/04/2020 0837   ALKPHOS 73 05/04/2020 0837   BILITOT 0.3 05/04/2020 0837   GFRNONAA >60 05/04/2020 0837   GFRAA 112 09/11/2018 0855   No results found for: CHOL, HDL, LDLCALC, LDLDIRECT, TRIG, CHOLHDL No results found for: HGBA1C Lab Results  Component Value Date   [id number] 05/04/2020   Lab Results  Component Value Date   TSH 2.360 09/11/2018      ASSESSMENT AND PLAN 21 y.o. year old female  has a past medical history of Allergy, Chronic pain, Dysautonomia ([place]), Endometriosis, GERD (gastroesophageal reflux disease), High cholesterol, and Migraine. here with:  1.  Migraine headaches  Well-controlled with Roselyn Meier and Aleve I have filled out her work form stating that she may have a migraine every  1 to 2 months and need to be out for the day. Roselyn Meier was refilled today future refills can come from PCP patient will follow with [place] on an as-needed basis    Ward Givens, MSN, NP-C 04/21/2021, 8:07 AM [facility] [address] [place], [place] [address] [phone]

## 2021-04-21 NOTE — Patient Instructions (Signed)
Your Plan:  Continue Cassandra Ramos to followup with pcp If your symptoms worsen or you develop new symptoms please let us know.       Thank you for coming to see [place] at [facility]. I hope we have been able to provide you high quality care today.  You may receive a patient satisfaction survey over the next few weeks. We would appreciate your feedback and comments so that we may continue to improve ourselves and the health of our patients.

## 2021-04-21 NOTE — Telephone Encounter (Signed)
PA completed on CMM/[employer] KEY:

## 2021-04-21 NOTE — Telephone Encounter (Signed)
KEY: [id number] Will await determination

## 2021-04-21 NOTE — Telephone Encounter (Signed)
PA approved   request is approved from 04/21/2021 to 04/21/2022 throug [employer] PA# [employer] - No Tiering - Std DAW Criteria [id number] SS

## 2021-05-31 ENCOUNTER — Encounter: Payer: Self-pay | Admitting: Hematology

## 2021-06-08 ENCOUNTER — Institutional Professional Consult (permissible substitution): Payer: BC Managed Care – PPO | Admitting: Neurology

## 2021-07-27 ENCOUNTER — Telehealth: Payer: Self-pay | Admitting: Adult Health

## 2021-07-27 NOTE — Telephone Encounter (Signed)
LMVM for pt that returned call.  °

## 2021-07-27 NOTE — Telephone Encounter (Signed)
Spoke to pt she is asking for work accomodation (only work 8 hour shifts due to her migraines).  Heat, long 12 hour shifts are triggering her migraines. She is taking duloxetine , ubrelvy prn and is not on ajovy. She stated that she has spoken to others and have gotten that accomodation.  I relayed that I would ask and let her know. Spoke with Aundra Millet, NP reviewed her chart. She will not approve doing this as pt only has 1 migraine every 1-2 months in which ubrelvy and aleve help to relieve this.  I LMVM for her megan's response that she will not do this.  She is to call back as needed.  ?

## 2021-07-27 NOTE — Telephone Encounter (Signed)
Pt said, at this working 12 hours, 5 days a week. Need fmla form changed to working 8 hours because working 12 hours a day is triggering my migraines; lasting 2 wks. Would like a call from the nurse. ?

## 2021-07-27 NOTE — Telephone Encounter (Signed)
Pt returned call. Please call back when available. 

## 2021-08-10 ENCOUNTER — Encounter: Payer: Self-pay | Admitting: Hematology

## 2021-08-22 ENCOUNTER — Encounter: Payer: Self-pay | Admitting: Hematology

## 2021-09-29 ENCOUNTER — Telehealth: Payer: Self-pay | Admitting: Adult Health

## 2021-09-29 NOTE — Telephone Encounter (Signed)
LVM asking pt to call back to discuss her appointment request sent via mychart. Per pod, pt is to be scheduled with [facility]

## 2021-10-20 ENCOUNTER — Encounter: Payer: Self-pay | Admitting: Adult Health

## 2021-10-20 ENCOUNTER — Ambulatory Visit (INDEPENDENT_AMBULATORY_CARE_PROVIDER_SITE_OTHER): Payer: BC Managed Care – PPO | Admitting: Adult Health

## 2021-10-20 VITALS — BP 99/60 | HR 69 | Ht 64.0 in | Wt 195.0 lb

## 2021-10-20 DIAGNOSIS — G43709 Chronic migraine without aura, not intractable, without status migrainosus: Secondary | ICD-10-CM | POA: Diagnosis not present

## 2021-10-20 MED ORDER — DULOXETINE HCL 60 MG PO CPEP: 60.0000 mg | ORAL_CAPSULE | Freq: Every day | ORAL | 3 refills | Status: DC

## 2021-10-20 NOTE — Patient Instructions (Signed)
Your Plan:  Continue cymbalta 60 mg - refill sent If your symptoms worsen or you develop new symptoms please let us know.       Thank you for coming to see [place] at [facility]. I hope we have been able to provide you high quality care today.  You may receive a patient satisfaction survey over the next few weeks. We would appreciate your feedback and comments so that we may continue to improve ourselves and the health of our patients.

## 2021-10-20 NOTE — Progress Notes (Signed)
PATIENT: Cassandra Ramos DOB: [birth year]  REASON FOR VISIT: follow up HISTORY FROM: patient PRIMARY NEUROLOGIST: Dr. Lucia Gaskins  Chief Complaint  Patient presents with   Rm 18    Pt in 18  pt  here migraine and fibromyalgia f/u  Pt states she off medications for a year ready to restart Pt states she wants to restart Cymbalta.        HISTORY OF PRESENT ILLNESS: Today 10/20/21:  Cassandra Ramos is a 21 year old female with a history of migraine headaches. She returns today for follow-up.   In regards to headaches having daily headaches. Reports this month as been worse. Cymbalta is the only thing that helped with her migraines. Using ubrevly with advil and that works to relieve her of her migraines. Reports that she had Cymbalta 60 mg left over and restarted about 1-2 weeks ago and that has been helping with her headaches. Would like refill   Reports that she went off all medication 1 year ago because she felt that the medication was interacting with each other.  Saw the patient in [place] but I was under the expression she was taking all of her mediation. At that time reported that migraines were under control with just using ubrelvy PRN. Reports that she was diagnosed with fibromyalgia by a rheumatologist at [facility]. Feels that fibromyalgia is getting worse.   04/21/21: Cassandra Ramos is a 21 year old female with a history of migraine headaches.  She states that her headaches have been under relatively good control.  She has approximately 1 headache every 1 to 2 months.  Cassandra Ramos with Aleve typically resolves her headaches fairly quickly.  She is asking for a work note today to excuse her from work when she has a migraine.  HISTORY 07/16/2019: She is feeling so much better, she found a great counselor for her pain, She saw a rheumatologist and did not have a good experience, a doctor told her she had fibromyalgia and was started on Cymbalta by psychiatry and she feels great. She is taking Ajovy and doing  well. She stopped prozac. She is doing great on 60mg . Seeing a therapist who is very good. Her therapist is .Psych Dr. Occupational psychologist. She is going to try stopping Ajovy. [name] works Cassandra Ramos.     01/13/2019: Rizatriptan did not work, she had the burning sensation in the back of the head.  Also causes chest tightness and neck pain. 13/04/2018 works. She is on Proxac. It was giving her appetite suppression but not anymore. 15 headache days a month, 10-12 are severe or moderately severe and migrainous, can last 4 hours - 24 hours usually 4 hours with treatment.      HPI:  Cassandra Ramos is a 21 y.o. female here as requested by 15, MD for headaches and migraines.  Mother is here and provides information. Past medical history of headaches seen in neurology, headache started 2013.  Past medical history of headaches and migraines, dysautonomia, chronic pain syndrome. FHx of hemiplegia migraines. She started getting headaches in puberty, started 1-2x a week. This past December she started getting massive headaches feeling like a nerve in the back of her neck. She felt her neck was squeezing and forward radiation behind her eye. Severe, she wants to pass out, severe and acute, she will go to the floor. This is new (but similar symptoms in previous notes), this is new, ataxia, acts like she is drunk, she is on the floor, her "eyes roll back".  She denies any hemiplegic migraines.In the past she had daily headaches and thinks she was drinking too much caffeine. She does not have daily headaches. Since January, even longer, mother says she has light sensitivity she does not like lights she likes the lights off, she has headaches with pulsating and pounding, no phonophobia, mild nausea, she also has chronic nausea and GI problems. 15 headache days a month, 10-12 are severe or moderately severe and migrainous, can last 4 hours - 24 hours usually 4 hours with treatment, using a headache icepack  helps. Movement makes it worse. No inciting events but has a family history, worse at night. Patient emphatically denies any aura after discussion of what an aura is. Headaches are worse with positiion and valsalva, she has had vision changes episodically, she has vertigo.    meds tried: nortriptyline (side effects), propranolol (side effects low pulse), magnesium ineffective, B2 ineffective, Zofran, pedal Dolex, Imitrex(chest tightness).  They declined Effexor and Topamax due to side effects and other family members, also tried steroids   Reviewed notes, labs and imaging from outside physicians, which showed:    Patient was seen several times by neurology Dr. Theora Master.  Headaches for 4 years initially seen August 23, 2015, worsening in the prior 4 months she has missed 30 days of school, head heart and stomach "issues".  History of dysautonomia.  Patient's mother and sister have history of headaches.  Headaches are daily with severe headaches occurring once a week, severe headaches are behind the left eye inside of the neck and in the apex of the head.  Pain is sharp, throbbing, dull, achy, pressure.  No known trigger.  Has not tried any medications.  Positive for photophobia and nausea.  Physical neurological exam normal.  She was diagnosed with tension headaches and migraines.  She declined several other prescription medications options but mother declined stating side effects and family members to each choice.   She was seen for chronic pain syndrome, they discussed relationship between pain sleep food and exercise.    REVIEW OF SYSTEMS: Out of a complete 14 system review of symptoms, the patient complains only of the following symptoms, and all other reviewed systems are negative.  ALLERGIES: Allergies  Allergen Reactions   Other     Allergic to an ingredient in certain Deoderants and something that is in Cetaphil face wash.     HOME MEDICATIONS: Outpatient Medications Prior to Visit   Medication Sig Dispense Refill   Ubrogepant 100 MG TABS Take 100 mg by mouth every 2 (two) hours as needed. For migraine. Maximum 200mg (2 tablets) in one day. 10 tablet 11   ADDERALL XR 20 MG 24 hr capsule Take 20 mg by mouth every morning. (Patient not taking: Reported on 10/20/2021)     busPIRone (BUSPAR) 10 MG tablet Take 10 mg by mouth 2 (two) times daily. (Patient not taking: Reported on 10/20/2021)     cetirizine (ZYRTEC) 10 MG tablet Take 10 mg by mouth daily. (Patient not taking: Reported on 10/20/2021)     Cholecalciferol (VITAMIN D) 50 MCG (2000 UT) tablet Take 2,000 Units by mouth daily. (Patient not taking: Reported on 10/20/2021)     cyanocobalamin (,VITAMIN B-12,) 1000 MCG/ML injection Inject 1,000 mcg into the muscle every 30 (thirty) days. (Patient not taking: Reported on 10/20/2021)     DULoxetine (CYMBALTA) 60 MG capsule Take 60 mg by mouth daily. (Patient not taking: Reported on 10/20/2021)     Fremanezumab-vfrm (AJOVY) 225 MG/1.5ML SOSY Inject 225  mg into the skin every 30 (thirty) days. (Patient not taking: Reported on 10/20/2021) 1.5 mL 11   Hyoscyamine Sulfate SL 0.125 MG SUBL  (Patient not taking: Reported on 10/20/2021)     mometasone (NASONEX) 50 MCG/ACT nasal spray Place 2 sprays into the nose daily. (Patient not taking: Reported on 10/20/2021)     pantoprazole (PROTONIX) 40 MG tablet Take 40 mg by mouth daily. (Patient not taking: Reported on 10/20/2021)     Peppermint Oil (IBGARD) 90 MG CPCR Take 1 capsule by mouth daily. (Patient not taking: Reported on 10/20/2021)     SPRINTEC 28 0.25-35 MG-MCG tablet Take 1 tablet by mouth daily. (Patient not taking: Reported on 10/20/2021)     traZODone (DESYREL) 50 MG tablet Take 100 mg by mouth at bedtime. (Patient not taking: Reported on 10/20/2021)     No facility-administered medications prior to visit.    PAST MEDICAL HISTORY: Past Medical History:  Diagnosis Date   Allergy    grass and tree pollen   Chronic pain    Dysautonomia  (HCC)    Endometriosis    suspected based on symptoms, runs in family   GERD (gastroesophageal reflux disease)    Herpes    High cholesterol    Migraine     PAST SURGICAL HISTORY: Past Surgical History:  Procedure Laterality Date   adnoidectomy      FAMILY HISTORY: Family History  Problem Relation Age of Onset   Asthma Mother    Endometriosis Mother    Migraines Mother    Hypertension Maternal Aunt    Diabetes Maternal Aunt    High Cholesterol Maternal Aunt    Asthma Maternal Aunt    Asthma Maternal Grandmother    Hypertension Maternal Grandmother    High Cholesterol Maternal Grandmother    Heart disease Maternal Grandmother    Cancer Maternal Grandmother        breast   Lung cancer Paternal Grandmother     SOCIAL HISTORY: Social History   Socioeconomic History   Marital status: Single    Spouse name: Not on file   Number of children: Not on file   Years of education: Not on file   Highest education level: High school graduate  Occupational History   Not on file  Tobacco Use   Smoking status: Never   Smokeless tobacco: Never  Vaping Use   Vaping Use: Never used  Substance and Sexual Activity   Alcohol use: Never   Drug use: Never   Sexual activity: Yes    Birth control/protection: Pill, Condom  Other Topics Concern   Not on file  Social History Narrative   LIves at home with mom, dad, sister, brother   Right handed   Works at [employer]   Caffeine: large coke every day   Social Determinants of Corporate investment banker Strain: Not on file  Food Insecurity: Not on file  Transportation Needs: Not on file  Physical Activity: Not on file  Stress: Not on file  Social Connections: Not on file  Intimate Partner Violence: Not on file      PHYSICAL EXAM  Vitals:   10/20/21 1424  BP: 99/60  Pulse: 69  Weight: 195 lb (88.5 kg)  Height: 5\' 4"  (1.626 m)   Body mass index is 33.47 kg/m.  Generalized: Well developed, in no acute distress    Neurological examination  Mentation: Alert oriented to time, place, history taking. Follows all commands speech and language fluent Cranial nerve II-XII:  Pupils were equal round reactive to light. Extraocular movements were full, visual field were full on confrontational test. Facial sensation and strength were normal. Uvula tongue midline. Head turning and shoulder shrug  were normal and symmetric. Motor: The motor testing reveals 5 over 5 strength of all 4 extremities. Good symmetric motor tone is noted throughout.  Sensory: Sensory testing is intact to soft touch on all 4 extremities. No evidence of extinction is noted.  Coordination: Cerebellar testing reveals good finger-nose-finger and heel-to-shin bilaterally.  Gait and station: Gait is normal.    DIAGNOSTIC DATA (LABS, IMAGING, TESTING) - I reviewed patient records, labs, notes, testing and imaging myself where available.  Lab Results  Component Value Date   WBC 7.5 05/04/2020   HGB 13.5 05/04/2020   HCT 42.4 05/04/2020   MCV 86.4 05/04/2020   PLT 251 05/04/2020      Component Value Date/Time   NA 137 05/04/2020 0837   NA 143 09/11/2018 0855   K 4.0 05/04/2020 0837   CL 106 05/04/2020 0837   CO2 25 05/04/2020 0837   GLUCOSE 111 (H) 05/04/2020 0837   BUN 10 05/04/2020 0837   BUN 11 09/11/2018 0855   CREATININE 0.93 05/04/2020 0837   CALCIUM 9.2 05/04/2020 0837   PROT 7.4 05/04/2020 0837   PROT 6.8 09/11/2018 0855   ALBUMIN 3.7 05/04/2020 0837   ALBUMIN 4.4 09/11/2018 0855   AST 13 (L) 05/04/2020 0837   ALT 18 05/04/2020 0837   ALKPHOS 73 05/04/2020 0837   BILITOT 0.3 05/04/2020 0837   GFRNONAA >60 05/04/2020 0837   GFRAA 112 09/11/2018 0855   No results found for: "CHOL", "HDL", "LDLCALC", "LDLDIRECT", "TRIG", "CHOLHDL" No results found for: "HGBA1C" Lab Results  Component Value Date   VITAMINB12 454 05/04/2020   Lab Results  Component Value Date   TSH 2.360 09/11/2018      ASSESSMENT AND PLAN 21  y.o. year old female  has a past medical history of Allergy, Chronic pain, Dysautonomia (HCC), Endometriosis, GERD (gastroesophageal reflux disease), Herpes, High cholesterol, and Migraine. here with:  1.  Migraine headaches  Continue Ubrelvy for abortive therapy. Restart Cymbalta 60 mg daily  I have filled out her work form stating that she may have a migraine every  1 to 2 months and need to be out for the day. Advised that she needs to establish care with rheumatology to manage fibromyalgia. Our office will not manage this as we did not make this diagnosis.  FU in 6 months or sooner if needed.     Butch Penny, MSN, NP-C 10/20/2021, 2:28 PM [facility] [address], [address] [place], [place] [address] [phone]

## 2021-11-27 ENCOUNTER — Telehealth: Payer: BC Managed Care – PPO | Admitting: Nurse Practitioner

## 2021-11-27 DIAGNOSIS — J069 Acute upper respiratory infection, unspecified: Secondary | ICD-10-CM | POA: Diagnosis not present

## 2021-11-27 MED ORDER — BENZONATATE 100 MG PO CAPS: 100.0000 mg | ORAL_CAPSULE | Freq: Two times a day (BID) | ORAL | 0 refills | Status: DC | PRN

## 2021-11-27 NOTE — Progress Notes (Signed)
I have spent 5 minutes in review of e-visit questionnaire, review and updating patient chart, medical decision making and response to patient.  ° °Delight Bickle W Quayshawn Nin, NP ° °  °

## 2021-11-27 NOTE — Progress Notes (Signed)
We are sorry that you are not feeling well.  Here is how we plan to help!  Based on your presentation I believe you most likely have A cough due to a virus.  This is called viral bronchitis and is best treated by rest, plenty of fluids and control of the cough.  You may use Ibuprofen or Tylenol as directed to help your symptoms.     In addition you may use A prescription cough medication called Tessalon Perles 100mg . You may take 1-2 capsules every 8 hours as needed for your cough. I recommend a nasal spray such as flonase for runny nose and motrin or tylenol for sore throat.   From your responses in the eVisit questionnaire you describe inflammation in the upper respiratory tract which is causing a significant cough.  This is commonly called Bronchitis and has four common causes:   Allergies Viral Infections Acid Reflux Bacterial Infection Allergies, viruses and acid reflux are treated by controlling symptoms or eliminating the cause. An example might be a cough caused by taking certain blood pressure medications. You stop the cough by changing the medication. Another example might be a cough caused by acid reflux. Controlling the reflux helps control the cough.  USE OF BRONCHODILATOR ("RESCUE") INHALERS: There is a risk from using your bronchodilator too frequently.  The risk is that over-reliance on a medication which only relaxes the muscles surrounding the breathing tubes can reduce the effectiveness of medications prescribed to reduce swelling and congestion of the tubes themselves.  Although you feel brief relief from the bronchodilator inhaler, your asthma may actually be worsening with the tubes becoming more swollen and filled with mucus.  This can delay other crucial treatments, such as oral steroid medications. If you need to use a bronchodilator inhaler daily, several times per day, you should discuss this with your provider.  There are probably better treatments that could be used to  keep your asthma under control.     HOME CARE Only take medications as instructed by your medical team. Complete the entire course of an antibiotic. Drink plenty of fluids and get plenty of rest. Avoid close contacts especially the very young and the elderly Cover your mouth if you cough or cough into your sleeve. Always remember to wash your hands A steam or ultrasonic humidifier can help congestion.   GET HELP RIGHT AWAY IF: You develop worsening fever. You become short of breath You cough up blood. Your symptoms persist after you have completed your treatment plan MAKE SURE YOU  Understand these instructions. Will watch your condition. Will get help right away if you are not doing well or get worse.    Thank you for choosing an e-visit.  Your e-visit answers were reviewed by a board certified advanced clinical practitioner to complete your personal care plan. Depending upon the condition, your plan could have included both over the counter or prescription medications.  Please review your pharmacy choice. Make sure the pharmacy is open so you can pick up prescription now. If there is a problem, you may contact your provider through [employer] and have the prescription routed to another pharmacy.  Your safety is important to [place]. If you have drug allergies check your prescription carefully.   For the next 24 hours you can use MyChart to ask questions about today's visit, request a non-urgent call back, or ask for a work or school excuse. You will get an email in the next two days asking about your experience.  I hope that your e-visit has been valuable and will speed your recovery.

## 2022-02-28 ENCOUNTER — Telehealth: Payer: BC Managed Care – PPO | Admitting: Physician Assistant

## 2022-02-28 DIAGNOSIS — R071 Chest pain on breathing: Secondary | ICD-10-CM

## 2022-02-28 DIAGNOSIS — J45901 Unspecified asthma with (acute) exacerbation: Secondary | ICD-10-CM

## 2022-02-28 NOTE — Progress Notes (Signed)
Because of chest pain associated with the difficulty breathing and need for re-evaluation of asthma, I feel your condition warrants further evaluation and I recommend that you be seen in a face to face visit.   NOTE: There will be NO CHARGE for this eVisit   If you are having a true medical emergency please call 911.      For an urgent face to face visit, [place] has seven urgent care centers for your convenience:     [facility] Center at [facility] Directions [phone] [address] [place], [place] [address]    [facility] Health Urgent Care Center [facility]) Get Driving Directions [phone] [address] [place], [place] [address]  [facility] Urgent [facility] Center [facility]) Get Driving Directions [phone] [address] [place],  [place]  [address]  [facility] Health Urgent Care Center [facility] - at [facility] Directions  [phone] [address] [place],  [place] [address]   [facility] Health Urgent Care at [facility] Get Driving Directions [phone] [address], [address] [place], [place] [address]   [facility] Health Urgent Care at [facility] Get Driving Directions  [phone] [address] [place], [place] [address]   [facility] Health Urgent Care at [facility] Directions [phone] [address], [address] [place], [place] [address]  Your MyChart E-visit questionnaire answers were reviewed by a board certified advanced clinical practitioner to complete your personal care plan based on your specific symptoms.  Thank you for using e-Visits.

## 2022-04-19 ENCOUNTER — Encounter: Payer: Self-pay | Admitting: Hematology

## 2022-06-01 ENCOUNTER — Ambulatory Visit: Payer: BC Managed Care – PPO | Admitting: Adult Health

## 2022-06-20 ENCOUNTER — Encounter: Payer: Self-pay | Admitting: Hematology

## 2022-06-28 ENCOUNTER — Encounter: Payer: Self-pay | Admitting: Hematology

## 2022-07-03 ENCOUNTER — Telehealth: Payer: BC Managed Care – PPO | Admitting: Physician Assistant

## 2022-07-03 DIAGNOSIS — S80269A Insect bite (nonvenomous), unspecified knee, initial encounter: Secondary | ICD-10-CM | POA: Diagnosis not present

## 2022-07-03 DIAGNOSIS — W57XXXA Bitten or stung by nonvenomous insect and other nonvenomous arthropods, initial encounter: Secondary | ICD-10-CM

## 2022-07-03 DIAGNOSIS — L299 Pruritus, unspecified: Secondary | ICD-10-CM | POA: Diagnosis not present

## 2022-07-03 NOTE — Progress Notes (Signed)
E-Visit for Tick Bite  Thank you for describing your tick bite, Here is how we plan to help! Based on the information that you shared with me it looks like you have An uncomplicated tick bite that just occurred and can be closely follow using the instructions in your care plan.  In most cases a tick bite is painless and does not itch.  Most tick bites in which the tick is quickly removed do not require prescriptions. Ticks can transmit several diseases if they are infected and remain attacked to your skin. Therefore the length that the tick was attached and any symptoms you have experienced after the bite are import to accurately develop your custom treatment plan. In most cases a single dose of doxycycline may prevent the development of a more serious condition.  Based on your information I have Provided a home care guide for tick bites and  instructions on when to call for help.  You can apply topical Hydrocortisone cream for itching.  Which ticks  are associated with illness?  The Wood Tick (dog tick) is the size of a watermelon seed and can sometimes transmit [facility] spotted fever and [place] tick fever.   The Deer Tick (black-legged tick) is between the size of a poppy seed (pin head) and an apple seed, and can sometimes transmit Lyme disease.  A brown to black tick with a white splotch on its back is likely a female Amblyomma americanum ([place] tick). This tick has been associated with Southern Tick Associated illness ( STARI)  Lyme disease has become the most common tick-borne illness in the [place]. The risk of Lyme disease following a recognized deer tick bite is estimated to be 1%.  The majority of cases of Lyme disease start with a bull's eye rash at the site of the tick bite. The rash can occur days to weeks (typically 7-10 days) after a tick bite. Treatment with antibiotics is indicated if this rash appears. Flu-like symptoms may accompany the rash, including:  fever, chills, headaches, muscle aches, and fatigue. Removing ticks promptly may prevent tick borne disease.  What can be used to prevent Tick Bites?  Insect repellant with at leas 20% DEET. Wearing long pants with sock and shoes. Avoiding tall grass and heavily wooded areas. Checking your skin after being outdoors. Shower with a washcloth after outdoor exposures.  HOME CARE ADVICE FOR TICK BITE  Wood Tick Removal:  Use a pair of tweezers and grasp the wood tick close to the skin (on its head). Pull the wood tick straight upward without twisting or crushing it. Maintain a steady pressure until it releases its grip.   If tweezers aren't available, use fingers, a loop of thread around the jaws, or a needle between the jaws for traction.  Note: covering the tick with petroleum jelly, nail polish or rubbing alcohol doesn't work. Neither does touching the tick with a hot or cold object. Tiny Deer Tick Removal:   Needs to be scraped off with a knife blade or credit card edge. Place tick in a sealed container (e.g. glass jar, zip lock plastic bag), in case your doctor wants to see it. Tick's Head Removal:  If the wood tick's head breaks off in the skin, it must be removed. Clean the skin. Then use a sterile needle to uncover the head and lift it out or scrape it off.  If a very small piece of the head remains, the skin will eventually slough it off. Antibiotic  Ointment:  Wash the wound and your hands with soap and water after removal to prevent catching any tick disease.  Apply an over the counter antibiotic ointment (e.g. bacitracin) to the bite once. Expected Course: Tick bites normally don't itch or hurt. That's why they often go unnoticed. Call Your Doctor If:  You can't remove the tick or the tick's head Fever, a severe head ache, or rash occur in the next 2 weeks Bite begins to look infected Lyme's disease is common in your area You have not had a tetanus in the last 10 years Your  current symptoms become worse    MAKE SURE YOU  Understand these instructions. Will watch your condition. Will get help right away if you are not doing well or get worse.    Thank you for choosing an e-visit.  Your e-visit answers were reviewed by a board certified advanced clinical practitioner to complete your personal care plan. Depending upon the condition, your plan could have included both over the counter or prescription medications.  Please review your pharmacy choice. Make sure the pharmacy is open so you can pick up prescription now. If there is a problem, you may contact your provider through [employer] and have the prescription routed to another pharmacy.  Your safety is important to [place]. If you have drug allergies check your prescription carefully.   For the next 24 hours you can use MyChart to ask questions about today's visit, request a non-urgent call back, or ask for a work or school excuse. You will get an email in the next two days asking about your experience. I hope that your e-visit has been valuable and will speed your recovery.  I have spent 5 minutes in review of e-visit questionnaire, review and updating patient chart, medical decision making and response to patient.   Margaretann Loveless, PA-C

## 2022-08-27 ENCOUNTER — Telehealth: Payer: BC Managed Care – PPO | Admitting: Family Medicine

## 2022-08-27 DIAGNOSIS — M797 Fibromyalgia: Secondary | ICD-10-CM | POA: Diagnosis not present

## 2022-08-27 NOTE — Progress Notes (Signed)
Virtual Visit Consent   Cassandra Ramos, you are scheduled for a virtual visit with a [place] provider today. Just as with appointments in the office, your consent must be obtained to participate. Your consent will be active for this visit and any virtual visit you may have with one of our providers in the next 365 days. If you have a MyChart account, a copy of this consent can be sent to you electronically.  As this is a virtual visit, video technology does not allow for your provider to perform a traditional examination. This may limit your provider's ability to fully assess your condition. If your provider identifies any concerns that need to be evaluated in person or the need to arrange testing (such as labs, EKG, etc.), we will make arrangements to do so. Although advances in technology are sophisticated, we cannot ensure that it will always work on either your end or our end. If the connection with a video visit is poor, the visit may have to be switched to a telephone visit. With either a video or telephone visit, we are not always able to ensure that we have a secure connection.  By engaging in this virtual visit, you consent to the provision of healthcare and authorize for your insurance to be billed (if applicable) for the services provided during this visit. Depending on your insurance coverage, you may receive a charge related to this service.  I need to obtain your verbal consent now. Are you willing to proceed with your visit today? Cassandra Ramos has provided verbal consent on 08/27/2022 for a virtual visit (video or telephone). Cassandra Curio, FNP  Date: 08/27/2022 11:04 AM  Virtual Visit via Video Note   I, Cassandra Ramos, connected with  Cassandra Ramos  ([id number], 04/13/2000) on 08/27/22 at  8:30 AM EDT by a video-enabled telemedicine application and verified that I am speaking with the correct person using two identifiers.  Location: Patient: Virtual Visit Location Patient:  Home Provider: Virtual Visit Location Provider: Home Office   I discussed the limitations of evaluation and management by telemedicine and the availability of in person appointments. The patient expressed understanding and agreed to proceed.    History of Present Illness: Cassandra Ramos is a 22 y.o. who identifies as a female who was assigned female at birth, and is being seen today for for a fibromyalgia flare with body aching, sore, fatigued this week. She is requesting a work note. Marland Kitchen  HPI: HPI  Problems:  Patient Active Problem List   Diagnosis Date Noted   Iron deficiency anemia 03/01/2020   Other chronic pain 07/16/2019   Chronic migraine without aura without status migrainosus, not intractable 09/11/2018    Allergies:  Allergies  Allergen Reactions   Other     Allergic to an ingredient in certain Deoderants and something that is in Cetaphil face wash.    Medications:  Current Outpatient Medications:    ADDERALL XR 20 MG 24 hr capsule, Take 20 mg by mouth every morning. (Patient not taking: Reported on 10/20/2021), Disp: , Rfl:    benzonatate (TESSALON) 100 MG capsule, Take 1 capsule (100 mg total) by mouth 2 (two) times daily as needed for cough., Disp: 20 capsule, Rfl: 0   busPIRone (BUSPAR) 10 MG tablet, Take 10 mg by mouth 2 (two) times daily. (Patient not taking: Reported on 10/20/2021), Disp: , Rfl:    cetirizine (ZYRTEC) 10 MG tablet, Take 10 mg by mouth daily. (Patient not taking: Reported on 10/20/2021), Disp: ,  Rfl:    Cholecalciferol (VITAMIN D) 50 MCG (2000 UT) tablet, Take 2,000 Units by mouth daily. (Patient not taking: Reported on 10/20/2021), Disp: , Rfl:    cyanocobalamin (,VITAMIN B-12,) 1000 MCG/ML injection, Inject 1,000 mcg into the muscle every 30 (thirty) days. (Patient not taking: Reported on 10/20/2021), Disp: , Rfl:    DULoxetine (CYMBALTA) 60 MG capsule, Take 1 capsule (60 mg total) by mouth daily., Disp: 90 capsule, Rfl: 3   Fremanezumab-vfrm (AJOVY) 225  MG/1.5ML SOSY, Inject 225 mg into the skin every 30 (thirty) days. (Patient not taking: Reported on 10/20/2021), Disp: 1.5 mL, Rfl: 11   Hyoscyamine Sulfate SL 0.125 MG SUBL, , Disp: , Rfl:    mometasone (NASONEX) 50 MCG/ACT nasal spray, Place 2 sprays into the nose daily. (Patient not taking: Reported on 10/20/2021), Disp: , Rfl:    pantoprazole (PROTONIX) 40 MG tablet, Take 40 mg by mouth daily. (Patient not taking: Reported on 10/20/2021), Disp: , Rfl:    Peppermint Oil (IBGARD) 90 MG CPCR, Take 1 capsule by mouth daily. (Patient not taking: Reported on 10/20/2021), Disp: , Rfl:    SPRINTEC 28 0.25-35 MG-MCG tablet, Take 1 tablet by mouth daily. (Patient not taking: Reported on 10/20/2021), Disp: , Rfl:    Ubrogepant 100 MG TABS, Take 100 mg by mouth every 2 (two) hours as needed. For migraine. Maximum 200mg (2 tablets) in one day., Disp: 10 tablet, Rfl: 11  Observations/Objective: Patient is well-developed, well-nourished in no acute distress.  Resting comfortably  at home.  Head is normocephalic, atraumatic.  No labored breathing.  Speech is clear and coherent with logical content.  Patient is alert and oriented at baseline.    Assessment and Plan: 1. Fibromyalgia  Follow up with PCP this week.   Follow Up Instructions: I discussed the assessment and treatment plan with the patient. The patient was provided an opportunity to ask questions and all were answered. The patient agreed with the plan and demonstrated an understanding of the instructions.  A copy of instructions were sent to the patient via MyChart unless otherwise noted below.     The patient was advised to call back or seek an in-person evaluation if the symptoms worsen or if the condition fails to improve as anticipated.  Time:  I spent 8 minutes with the patient via telehealth technology discussing the above problems/concerns.    Cassandra Curio, FNP

## 2022-08-27 NOTE — Patient Instructions (Signed)
Myofascial Pain Syndrome and Fibromyalgia Myofascial pain syndrome and fibromyalgia are both pain disorders. You may feel this pain mainly in your muscles. Myofascial pain syndrome: Always has tender points in the muscles that will cause pain when pressed (trigger points). The pain may come and go. Usually affects your neck, upper back, and shoulder areas. The pain often moves into your arms and hands. Fibromyalgia: Has muscle pains and tenderness that come and go. Is often associated with tiredness (fatigue) and sleep problems. Has trigger points. Tends to be long-lasting (chronic), but is not life-threatening. Fibromyalgia and myofascial pain syndrome are not the same. However, they often occur together. If you have both conditions, each can make the other worse. Both are common and can cause enough pain and fatigue to make day-to-day activities difficult. Both can be hard to diagnose because their symptoms are common in many other conditions. What are the causes? The exact causes of these conditions are not known. What increases the risk? You are more likely to develop either of these conditions if: You have a family history of the condition. You are female. You have certain triggers, such as: Spine disorders. An injury (trauma) or other physical stressors. Being under a lot of stress. Medical conditions such as osteoarthritis, rheumatoid arthritis, or lupus. What are the signs or symptoms? Fibromyalgia The main symptom of fibromyalgia is widespread pain and tenderness in your muscles. Pain is sometimes described as stabbing, shooting, or burning. You may also have: Tingling or numbness. Sleep problems and fatigue. Problems with attention and concentration (fibro fog). Other symptoms may include: Bowel and bladder problems. Headaches. Vision problems. Sensitivity to odors and noises. Depression or mood changes. Painful menstrual periods (dysmenorrhea). Dry skin or eyes. These  symptoms can vary over time. Myofascial pain syndrome Symptoms of myofascial pain syndrome include: Tight, ropy bands of muscle. Uncomfortable sensations in muscle areas. These may include aching, cramping, burning, numbness, tingling, and weakness. Difficulty moving certain parts of the body freely (poor range of motion). How is this diagnosed? This condition may be diagnosed by your symptoms and medical history. You will also have a physical exam. In general: Fibromyalgia is diagnosed if you have pain, fatigue, and other symptoms for more than 3 months, and symptoms cannot be explained by another condition. Myofascial pain syndrome is diagnosed if you have trigger points in your muscles, and those trigger points are tender and cause pain elsewhere in your body (referred pain). How is this treated? Treatment for these conditions depends on the type that you have. For fibromyalgia, a healthy lifestyle is the most important treatment including aerobic and strength exercises. Different types of medicines are used to help treat pain and include: NSAIDs. Medicines for treating depression. Medicines that help control seizures. Medicines that relax the muscles. Treatment for myofascial pain syndrome includes: Pain medicines, such as NSAIDs. Cooling and stretching of muscles. Massage therapy with myofascial release technique. Trigger point injections. Treating these conditions often requires a team of health care providers. These may include: Your primary care provider. A physical therapist. Complementary health care providers, such as massage therapists or acupuncturists. A psychiatrist for cognitive behavioral therapy. Follow these instructions at home: Medicines Take over-the-counter and prescription medicines only as told by your health care provider. Ask your health care provider if the medicine prescribed to you: Requires you to avoid driving or using machinery. Can cause constipation.  You may need to take these actions to prevent or treat constipation: Drink enough fluid to keep your urine pale   yellow. Take over-the-counter or prescription medicines. Eat foods that are high in fiber, such as beans, whole grains, and fresh fruits and vegetables. Limit foods that are high in fat and processed sugars, such as fried or sweet foods. Lifestyle  Do exercises as told by your health care provider or physical therapist. Practice relaxation techniques to control your stress. You may want to try: Biofeedback. Visual imagery. Hypnosis. Muscle relaxation. Yoga. Meditation. Maintain a healthy lifestyle. This includes eating a healthy diet and getting enough sleep. Do not use any products that contain nicotine or tobacco. These products include cigarettes, chewing tobacco, and vaping devices, such as e-cigarettes. If you need help quitting, ask your health care provider. General instructions Talk to your health care provider about complementary treatments, such as acupuncture or massage. Do not do activities that stress or strain your muscles. This includes repetitive motions and heavy lifting. Keep all follow-up visits. This is important. Where to find support Consider joining a support group with others who are diagnosed with this condition. National Fibromyalgia Association: fmaware.org Where to find more information U.S. Pain Foundation: uspainfoundation.org Contact a health care provider if: You have new symptoms. Your symptoms get worse or your pain is severe. You have side effects from your medicines. You have trouble sleeping. Your condition is causing depression or anxiety. Get help right away if: You have thoughts of hurting yourself or others. Get help right away if you feel like you may hurt yourself or others, or have thoughts about taking your own life. Go to your nearest emergency room or: Call 911. Call the National Suicide Prevention Lifeline at 1-800-273-8255  or 988. This is open 24 hours a day. Text the Crisis Text Line at 741741. This information is not intended to replace advice given to you by your health care provider. Make sure you discuss any questions you have with your health care provider. Document Revised: 12/05/2021 Document Reviewed: 01/28/2021 Elsevier Patient Education  2024 Elsevier Inc.  

## 2022-08-28 ENCOUNTER — Encounter: Payer: Self-pay | Admitting: Hematology

## 2022-09-30 ENCOUNTER — Telehealth: Payer: BC Managed Care – PPO | Admitting: Nurse Practitioner

## 2022-09-30 DIAGNOSIS — N809 Endometriosis, unspecified: Secondary | ICD-10-CM

## 2022-09-30 DIAGNOSIS — N946 Dysmenorrhea, unspecified: Secondary | ICD-10-CM | POA: Diagnosis not present

## 2022-09-30 DIAGNOSIS — E282 Polycystic ovarian syndrome: Secondary | ICD-10-CM

## 2022-09-30 NOTE — Progress Notes (Signed)
Virtual Visit Consent   Cassandra Ramos, you are scheduled for a virtual visit with [name], FNP, a [facility] provider, today.     Just as with appointments in the office, your consent must be obtained to participate.  Your consent will be active for this visit and any virtual visit you may have with one of our providers in the next 365 days.     If you have a MyChart account, a copy of this consent can be sent to you electronically.  All virtual visits are billed to your insurance company just like a traditional visit in the office.    As this is a virtual visit, video technology does not allow for your provider to perform a traditional examination.  This may limit your provider's ability to fully assess your condition.  If your provider identifies any concerns that need to be evaluated in person or the need to arrange testing (such as labs, EKG, etc.), we will make arrangements to do so.     Although advances in technology are sophisticated, we cannot ensure that it will always work on either your end or our end.  If the connection with a video visit is poor, the visit may have to be switched to a telephone visit.  With either a video or telephone visit, we are not always able to ensure that we have a secure connection.     I need to obtain your verbal consent now.   Are you willing to proceed with your visit today? YES   Taelyn Turnbo has provided verbal consent on 09/30/2022 for a virtual visit (video or telephone).   [name], FNP   Date: 09/30/2022 9:57 AM   Virtual Visit via Video Note   I, [name], connected with Cassandra Ramos ([id number], September 14, 2000) on 09/30/22 at 10:00 AM EDT by a video-enabled telemedicine application and verified that I am speaking with the correct person using two identifiers.  Location: Patient: Virtual Visit Location Patient: Home Provider: Virtual Visit Location Provider: Mobile   I discussed the limitations of  evaluation and management by telemedicine and the availability of in person appointments. The patient expressed understanding and agreed to proceed.    History of Present Illness: Cassandra Ramos is a 22 y.o. who identifies as a female who was assigned female at birth, and is being seen today for syncope.  HPI: Patient says she has a history of endometriosis and PCOS. Bleeding heavenly with cramping. Patient says she almost passed out and had to lay down. Did not lose conscienousness or fall or inure herself. Rates cramping 7/10 currently. Has not taken any meds and doe snot want prescription.    ROS  Problems:  Patient Active Problem List   Diagnosis Date Noted   Iron deficiency anemia 03/01/2020   Other chronic pain 07/16/2019   Chronic migraine without aura without status migrainosus, not intractable 09/11/2018    Allergies:  Allergies  Allergen Reactions   Other     Allergic to an ingredient in certain Deoderants and something that is in Cetaphil face wash.    Medications:  Current Outpatient Medications:    ADDERALL XR 20 MG 24 hr capsule, Take 20 mg by mouth every morning. (Patient not taking: Reported on 10/20/2021), Disp: , Rfl:    benzonatate (TESSALON) 100 MG capsule, Take 1 capsule (100 mg total) by mouth 2 (two) times daily as needed for cough., Disp: 20 capsule, Rfl: 0   busPIRone (BUSPAR) 10 MG tablet, Take 10  mg by mouth 2 (two) times daily. (Patient not taking: Reported on 10/20/2021), Disp: , Rfl:    cetirizine (ZYRTEC) 10 MG tablet, Take 10 mg by mouth daily. (Patient not taking: Reported on 10/20/2021), Disp: , Rfl:    Cholecalciferol (VITAMIN D) 50 MCG (2000 UT) tablet, Take 2,000 Units by mouth daily. (Patient not taking: Reported on 10/20/2021), Disp: , Rfl:    cyanocobalamin (,VITAMIN B-12,) 1000 MCG/ML injection, Inject 1,000 mcg into the muscle every 30 (thirty) days. (Patient not taking: Reported on 10/20/2021), Disp: , Rfl:    DULoxetine (CYMBALTA) 60 MG capsule,  Take 1 capsule (60 mg total) by mouth daily., Disp: 90 capsule, Rfl: 3   Fremanezumab-vfrm (AJOVY) 225 MG/1.5ML SOSY, Inject 225 mg into the skin every 30 (thirty) days. (Patient not taking: Reported on 10/20/2021), Disp: 1.5 mL, Rfl: 11   Hyoscyamine Sulfate SL 0.125 MG SUBL, , Disp: , Rfl:    mometasone (NASONEX) 50 MCG/ACT nasal spray, Place 2 sprays into the nose daily. (Patient not taking: Reported on 10/20/2021), Disp: , Rfl:    pantoprazole (PROTONIX) 40 MG tablet, Take 40 mg by mouth daily. (Patient not taking: Reported on 10/20/2021), Disp: , Rfl:    Peppermint Oil (IBGARD) 90 MG CPCR, Take 1 capsule by mouth daily. (Patient not taking: Reported on 10/20/2021), Disp: , Rfl:    SPRINTEC 28 0.25-35 MG-MCG tablet, Take 1 tablet by mouth daily. (Patient not taking: Reported on 10/20/2021), Disp: , Rfl:    Ubrogepant 100 MG TABS, Take 100 mg by mouth every 2 (two) hours as needed. For migraine. Maximum 200mg (2 tablets) in one day., Disp: 10 tablet, Rfl: 11  Observations/Objective: Patient is well-developed, well-nourished in no acute distress.  Resting comfortably  at home.  Head is normocephalic, atraumatic.  No labored breathing.  Speech is clear and coherent with logical content.  Patient is alert and oriented at baseline.    Assessment and Plan:  Cassandra Ramos in today with chief complaint of Loss of Consciousness   1. Dysmenorrhea 2. Endometriosis 3. PCOS (polycystic ovarian syndrome) Patient refuses medication Warm compresses to abdomen Rest Motrin OTC recommended Note for work in my chart   Follow Up Instructions: I discussed the assessment and treatment plan with the patient. The patient was provided an opportunity to ask questions and all were answered. The patient agreed with the plan and demonstrated an understanding of the instructions.  A copy of instructions were sent to the patient via MyChart.  The patient was advised to call back or seek an in-person evaluation if  the symptoms worsen or if the condition fails to improve as anticipated.  Time:  I spent 7 minutes with the patient via telehealth technology discussing the above problems/concerns.    [name], FNP

## 2022-09-30 NOTE — Patient Instructions (Signed)
Yvonna Borntreger, thank you for joining Bennie Pierini, FNP for today's virtual visit.  While this provider is not your primary care provider (PCP), if your PCP is located in our provider database this encounter information will be shared with them immediately following your visit.   A [facility] MyChart account gives you access to today's visit and all your visits, tests, and labs performed at [facility] " click here if you don't have a [facility] MyChart account or go to [email or web address]  Consent: (Patient) Cassandra Ramos provided verbal consent for this virtual visit at the beginning of the encounter.  Current Medications:  Current Outpatient Medications:    ADDERALL XR 20 MG 24 hr capsule, Take 20 mg by mouth every morning. (Patient not taking: Reported on 10/20/2021), Disp: , Rfl:    benzonatate (TESSALON) 100 MG capsule, Take 1 capsule (100 mg total) by mouth 2 (two) times daily as needed for cough., Disp: 20 capsule, Rfl: 0   busPIRone (BUSPAR) 10 MG tablet, Take 10 mg by mouth 2 (two) times daily. (Patient not taking: Reported on 10/20/2021), Disp: , Rfl:    cetirizine (ZYRTEC) 10 MG tablet, Take 10 mg by mouth daily. (Patient not taking: Reported on 10/20/2021), Disp: , Rfl:    Cholecalciferol (VITAMIN D) 50 MCG (2000 UT) tablet, Take 2,000 Units by mouth daily. (Patient not taking: Reported on 10/20/2021), Disp: , Rfl:    cyanocobalamin (,VITAMIN B-12,) 1000 MCG/ML injection, Inject 1,000 mcg into the muscle every 30 (thirty) days. (Patient not taking: Reported on 10/20/2021), Disp: , Rfl:    DULoxetine (CYMBALTA) 60 MG capsule, Take 1 capsule (60 mg total) by mouth daily., Disp: 90 capsule, Rfl: 3   Fremanezumab-vfrm (AJOVY) 225 MG/1.5ML SOSY, Inject 225 mg into the skin every 30 (thirty) days. (Patient not taking: Reported on 10/20/2021), Disp: 1.5 mL, Rfl: 11   Hyoscyamine Sulfate SL 0.125 MG SUBL, , Disp: , Rfl:    mometasone (NASONEX) 50 MCG/ACT nasal spray,  Place 2 sprays into the nose daily. (Patient not taking: Reported on 10/20/2021), Disp: , Rfl:    pantoprazole (PROTONIX) 40 MG tablet, Take 40 mg by mouth daily. (Patient not taking: Reported on 10/20/2021), Disp: , Rfl:    Peppermint Oil (IBGARD) 90 MG CPCR, Take 1 capsule by mouth daily. (Patient not taking: Reported on 10/20/2021), Disp: , Rfl:    SPRINTEC 28 0.25-35 MG-MCG tablet, Take 1 tablet by mouth daily. (Patient not taking: Reported on 10/20/2021), Disp: , Rfl:    Ubrogepant 100 MG TABS, Take 100 mg by mouth every 2 (two) hours as needed. For migraine. Maximum 200mg (2 tablets) in one day., Disp: 10 tablet, Rfl: 11   Medications ordered in this encounter:  No orders of the defined types were placed in this encounter.    *If you need refills on other medications prior to your next appointment, please contact your pharmacy*  Follow-Up: Call back or seek an in-person evaluation if the symptoms worsen or if the condition fails to improve as anticipated.  [facility] [phone]  Other Instructions Patient refuses medication Warm compresses to abdomen Rest Motrin OTC recommended Note for work in my chart   If you have been instructed to have an in-person evaluation today at a local Urgent Care facility, please use the link below. It will take you to a list of all of our available [facility], including address, phone number and hours of operation. Please do not delay care.  [facility]  If you or a family member do not have a primary care provider, use the link below to schedule a visit and establish care. When you choose a [facility] primary care physician or advanced practice provider, you gain a long-term partner in health. Find a Primary Care Provider  Learn more about [facility]'s in-office and virtual care options: [place] - Get Care Now

## 2022-10-30 ENCOUNTER — Other Ambulatory Visit: Payer: Self-pay | Admitting: Adult Health

## 2022-10-30 DIAGNOSIS — G43709 Chronic migraine without aura, not intractable, without status migrainosus: Secondary | ICD-10-CM

## 2022-11-09 ENCOUNTER — Ambulatory Visit (HOSPITAL_COMMUNITY)
Admission: EM | Admit: 2022-11-09 | Discharge: 2022-11-09 | Disposition: A | Discharge status: Home / Self Care | Payer: BC Managed Care – PPO | Attending: Physician Assistant | Admitting: Physician Assistant

## 2022-11-09 ENCOUNTER — Encounter (HOSPITAL_COMMUNITY): Payer: Self-pay

## 2022-11-09 DIAGNOSIS — J069 Acute upper respiratory infection, unspecified: Secondary | ICD-10-CM

## 2022-11-09 DIAGNOSIS — R0981 Nasal congestion: Secondary | ICD-10-CM | POA: Diagnosis not present

## 2022-11-09 DIAGNOSIS — Z1152 Encounter for screening for COVID-19: Secondary | ICD-10-CM | POA: Insufficient documentation

## 2022-11-09 DIAGNOSIS — R051 Acute cough: Secondary | ICD-10-CM | POA: Diagnosis not present

## 2022-11-09 LAB — POCT INFLUENZA A/B
Influenza A, POC: NEGATIVE
Influenza B, POC: NEGATIVE

## 2022-11-09 MED ORDER — PROMETHAZINE-DM 6.25-15 MG/5ML PO SYRP: 5.0000 mL | ORAL_SOLUTION | Freq: Four times a day (QID) | ORAL | 0 refills | Status: DC | PRN

## 2022-11-09 MED ORDER — IPRATROPIUM BROMIDE 0.03 % NA SOLN: 2.0000 | Freq: Two times a day (BID) | NASAL | 0 refills | Status: DC

## 2022-11-09 MED ORDER — ALBUTEROL SULFATE HFA 108 (90 BASE) MCG/ACT IN AERS: 1.0000 | INHALATION_SPRAY | Freq: Four times a day (QID) | RESPIRATORY_TRACT | 0 refills | Status: DC | PRN

## 2022-11-09 NOTE — Discharge Instructions (Signed)
You tested negative for influenza.  We will contact you if you are positive for COVID.  Monitor your MyChart for this result.  Use (DM for cough.  This will make you sleepy so do not drive or drink alcohol taking it.  Use ipratropium nasal spray to help with the congestion.  I also recommend over-the-counter medication including Mucinex, Flonase, Tylenol, ibuprofen, nasal saline/sinus rinses.  If your symptoms are not improving within a week please return for reevaluation.  If anything worsens and you have worsening cough, shortness of breath, weakness, nausea/vomiting you need to be seen immediately.

## 2022-11-09 NOTE — ED Triage Notes (Signed)
Cough; Runny Nose; Sore Throat, fatigue, sinus pressure, ear drainage, eye pressure, and feeling feverish onset 3 days ago. Patient works in [employer] so around a lot of COVID Patient's.   Patient tried cold and flu otc meds last night with little relief.

## 2022-11-09 NOTE — ED Provider Notes (Signed)
[facility]    CSN: [id number] Arrival date & time: 11/09/22  1757      History   Chief Complaint Chief Complaint  Patient presents with   Cough   Sore Throat    HPI Cassandra Ramos is a 22 y.o. female.   Patient presents today with a 3-day history of URI symptoms.  She reports a cough, rhinorrhea, sneezing, sore throat, sinus pressure, headache.  She has not measured a fever and denies any chest pain, shortness of breath, nausea, vomiting, diarrhea.  She has tried over-the-counter cold and flu medication with minimal improvement of symptoms.  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  She has not had COVID.  She has not COVID-19 vaccines.  She denies any recent antibiotics or steroids.  She is confident that she is not pregnant.  She does report a past medical history of asthma but has not needed an albuterol inhaler recently.    Past Medical History:  Diagnosis Date   Allergy    grass and tree pollen   Chronic pain    Dysautonomia (HCC)    Endometriosis    suspected based on symptoms, runs in family   GERD (gastroesophageal reflux disease)    Herpes    High cholesterol    Migraine     Patient Active Problem List   Diagnosis Date Noted   Iron deficiency anemia 03/01/2020   Other chronic pain 07/16/2019   Chronic migraine without aura without status migrainosus, not intractable 09/11/2018    Past Surgical History:  Procedure Laterality Date   adnoidectomy      OB History   No obstetric history on file.      Home Medications    Prior to Admission medications   Medication Sig Start Date End Date Taking? Authorizing Provider  albuterol (VENTOLIN HFA) 108 (90 Base) MCG/ACT inhaler Inhale 1-2 puffs into the lungs every 6 (six) hours as needed for wheezing or shortness of breath. 11/09/22  Yes Lyrah Bradt K, PA-C  DULoxetine (CYMBALTA) 60 MG capsule TAKE 1 CAPSULE BY MOUTH EVERY DAY 11/05/22  Yes Millikan, Megan, NP  escitalopram  (LEXAPRO) 10 MG tablet Take 1 tablet by mouth daily. 11/05/22  Yes [provider]  ipratropium (ATROVENT) 0.03 % nasal spray Place 2 sprays into both nostrils every 12 (twelve) hours. 11/09/22  Yes Bob Eastwood K, PA-C  LO LOESTRIN FE 1 MG-10 MCG / 10 MCG tablet Take 1 tablet by mouth daily. 10/11/22  Yes [provider]  promethazine-dextromethorphan (PROMETHAZINE-DM) 6.25-15 MG/5ML syrup Take 5 mLs by mouth 4 (four) times daily as needed for cough. 11/09/22  Yes Saoirse Legere K, PA-C  UBRELVY 100 MG TABS TAKE 1 TABLET BY MOUTH UP TO TWICE DAILY AS NEEDED FOR MIGRAINE. WAIT AT LEAST 2HRS BETWEEN DOSES 11/05/22   Butch Penny, NP    Family History Family History  Problem Relation Age of Onset   Asthma Mother    Endometriosis Mother    Migraines Mother    Hypertension Maternal Aunt    Diabetes Maternal Aunt    High Cholesterol Maternal Aunt    Asthma Maternal Aunt    Asthma Maternal Grandmother    Hypertension Maternal Grandmother    High Cholesterol Maternal Grandmother    Heart disease Maternal Grandmother    Cancer Maternal Grandmother        breast   Lung cancer Paternal Grandmother     Social History Social History  Tobacco Use   Smoking status: Never   Smokeless tobacco: Never  Vaping Use   Vaping status: Never Used  Substance Use Topics   Alcohol use: Never   Drug use: Never     Allergies   Other   Review of Systems Review of Systems  Constitutional:  Positive for activity change and fatigue. Negative for appetite change and fever.  HENT:  Positive for congestion, postnasal drip, rhinorrhea, sinus pressure, sneezing and sore throat.   Respiratory:  Positive for cough. Negative for shortness of breath.   Cardiovascular:  Negative for chest pain.  Gastrointestinal:  Negative for abdominal pain, diarrhea, nausea and vomiting.  Musculoskeletal:  Negative for arthralgias and myalgias.  Neurological:  Positive for headaches. Negative for dizziness  and light-headedness.     Physical Exam Triage Vital Signs ED Triage Vitals  Encounter Vitals Group     BP 11/09/22 1924 113/80     Systolic BP Percentile --      Diastolic BP Percentile --      Pulse Rate 11/09/22 1924 98     Resp 11/09/22 1924 18     Temp 11/09/22 1924 98.1 F (36.7 C)     Temp Source 11/09/22 1924 Oral     SpO2 11/09/22 1924 96 %     Weight 11/09/22 1924 195 lb 1.7 oz (88.5 kg)     Height 11/09/22 1924 5\' 4"  (1.626 m)     Head Circumference --      Peak Flow --      Pain Score 11/09/22 1921 3     Pain Loc --      Pain Education --      Exclude from Growth Chart --    No data found.  Updated Vital Signs BP 113/80 (BP Location: Right Arm)   Pulse 98   Temp 98.1 F (36.7 C) (Oral)   Resp 18   Ht 5\' 4"  (1.626 m)   Wt 195 lb 1.7 oz (88.5 kg)   LMP 10/30/2022 (Approximate)   SpO2 96%   BMI 33.49 kg/m   Visual Acuity Right Eye Distance:   Left Eye Distance:   Bilateral Distance:    Right Eye Near:   Left Eye Near:    Bilateral Near:     Physical Exam Vitals reviewed.  Constitutional:      General: She is awake. She is not in acute distress.    Appearance: Normal appearance. She is well-developed. She is not ill-appearing.     Comments: Very pleasant female appears stated age in no acute distress sitting comfortable in exam room  HENT:     Head: Normocephalic and atraumatic.     Right Ear: Tympanic membrane, ear canal and external ear normal. Tympanic membrane is not erythematous or bulging.     Left Ear: Tympanic membrane, ear canal and external ear normal. Tympanic membrane is not erythematous or bulging.     Mouth/Throat:     Pharynx: Uvula midline. No oropharyngeal exudate or posterior oropharyngeal erythema.     Tonsils: No tonsillar exudate or tonsillar abscesses.  Cardiovascular:     Rate and Rhythm: Normal rate and regular rhythm.     Heart sounds: Normal heart sounds, S1 normal and S2 normal. No murmur heard. Pulmonary:      Effort: Pulmonary effort is normal.     Breath sounds: Normal breath sounds. No wheezing, rhonchi or rales.     Comments: Clear to auscultation bilaterally Psychiatric:  Behavior: Behavior is cooperative.      UC Treatments / Results  Labs (all labs ordered are listed, but only abnormal results are displayed) Labs Reviewed  SARS CORONAVIRUS 2 (TAT 6-24 HRS)  POCT INFLUENZA A/B    EKG   Radiology No results found.  Procedures Procedures (including critical care time)  Medications Ordered in UC Medications - No data to display  Initial Impression / Assessment and Plan / UC Course  I have reviewed the triage vital signs and the nursing notes.  Pertinent labs & imaging results that were available during my care of the patient were reviewed by me and considered in my medical decision making (see chart for details).     Patient is well-appearing, afebrile, nontoxic, nontachycardic.  Vital signs of physical exam are reassuring with no indication for emergent evaluation or imaging.  No evidence of acute infection on physical exam that would warrant initiation of antibiotics.  Employer requested influenza testing which was obtained and negative in clinic today.  Discussed that symptoms are likely related to COVID.  COVID testing was obtained and is pending.  She is a candidate for antiviral therapy given her history of asthma, however, given medication interactions she is unable to take Paxlovid and I would not recommend molnupiravir.  Will treat symptomatically and Promethazine DM was sent to the pharmacy.  We discussed that this is sedating and she should not drive or drink alcohol taking it.  Refill of albuterol inhaler was sent to have on hand and we discussed that if she is having to use this regularly she should return for reevaluation.  She is to use over-the-counter medication including Mucinex, Flonase, Tylenol.  Also recommended that she rest and drink plenty of fluid and  use a humidifier as well as nasal saline rinses for symptom relief.  Discussed that if her symptoms are not improving within a week she should return for reevaluation.  If she has any worsening symptoms she needs to be seen immediately.  Strict return precautions given.  Work excuse note provided.  Patient is candidate for antiviral therapy: No due to medication interactions   Final Clinical Impressions(s) / UC Diagnoses   Final diagnoses:  Upper respiratory tract infection, unspecified type  Acute cough  Nasal congestion     Discharge Instructions      You tested negative for influenza.  We will contact you if you are positive for COVID.  Monitor your MyChart for this result.  Use (DM for cough.  This will make you sleepy so do not drive or drink alcohol taking it.  Use ipratropium nasal spray to help with the congestion.  I also recommend over-the-counter medication including Mucinex, Flonase, Tylenol, ibuprofen, nasal saline/sinus rinses.  If your symptoms are not improving within a week please return for reevaluation.  If anything worsens and you have worsening cough, shortness of breath, weakness, nausea/vomiting you need to be seen immediately.     ED Prescriptions     Medication Sig Dispense Auth. Provider   promethazine-dextromethorphan (PROMETHAZINE-DM) 6.25-15 MG/5ML syrup Take 5 mLs by mouth 4 (four) times daily as needed for cough. 118 mL Anberlin Diez K, PA-C   ipratropium (ATROVENT) 0.03 % nasal spray Place 2 sprays into both nostrils every 12 (twelve) hours. 30 mL Mead Slane K, PA-C   albuterol (VENTOLIN HFA) 108 (90 Base) MCG/ACT inhaler Inhale 1-2 puffs into the lungs every 6 (six) hours as needed for wheezing or shortness of breath. 18 g Latiffany Harwick K, PA-C  PDMP not reviewed this encounter.   Jeani Hawking, PA-C 11/09/22 2029

## 2022-11-10 LAB — SARS CORONAVIRUS 2 (TAT 6-24 HRS): SARS Coronavirus 2: NEGATIVE

## 2022-11-20 ENCOUNTER — Encounter: Payer: Self-pay | Admitting: Hematology

## 2022-12-12 ENCOUNTER — Telehealth: Payer: Self-pay | Admitting: Internal Medicine

## 2022-12-12 ENCOUNTER — Encounter: Payer: Self-pay | Admitting: Physician Assistant

## 2022-12-12 ENCOUNTER — Telehealth: Payer: BC Managed Care – PPO | Admitting: Physician Assistant

## 2022-12-12 DIAGNOSIS — G901 Familial dysautonomia [Riley-Day]: Secondary | ICD-10-CM | POA: Diagnosis not present

## 2022-12-12 NOTE — Progress Notes (Signed)
Virtual Visit Consent   Cassandra Ramos, you are scheduled for a virtual visit with a [facility] provider today. Just as with appointments in the office, your consent must be obtained to participate. Your consent will be active for this visit and any virtual visit you may have with one of our providers in the next 365 days. If you have a MyChart account, a copy of this consent can be sent to you electronically.  As this is a virtual visit, video technology does not allow for your provider to perform a traditional examination. This may limit your provider's ability to fully assess your condition. If your provider identifies any concerns that need to be evaluated in person or the need to arrange testing (such as labs, EKG, etc.), we will make arrangements to do so. Although advances in technology are sophisticated, we cannot ensure that it will always work on either your end or our end. If the connection with a video visit is poor, the visit may have to be switched to a telephone visit. With either a video or telephone visit, we are not always able to ensure that we have a secure connection.  By engaging in this virtual visit, you consent to the provision of healthcare and authorize for your insurance to be billed (if applicable) for the services provided during this visit. Depending on your insurance coverage, you may receive a charge related to this service.  I need to obtain your verbal consent now. Are you willing to proceed with your visit today? Cassandra Ramos has provided verbal consent on 12/12/2022 for a virtual visit (video or telephone). Cassandra Ramos, [place]  Date: 12/12/2022 2:11 PM  Virtual Visit via Video Note   I, Cassandra Ramos, connected with  Cassandra Ramos  ([id number], 06/08/2000) on 12/12/22 at  2:00 PM EDT by a video-enabled telemedicine application and verified that I am speaking with the correct person using two identifiers.  Location: Patient: Virtual Visit Location  Patient: Home Provider: Virtual Visit Location Provider: Home Office   I discussed the limitations of evaluation and management by telemedicine and the availability of in person appointments. The patient expressed understanding and agreed to proceed.    History of Present Illness: Cassandra Ramos is a 22 y.o. who identifies as a female who was assigned female at birth, and is being seen today for a flare of her dysautonomia this morning which she feels is due to being overworked and poorly hydrated. Notes longstanding history of dysautonomia, previously evaluated and managed by Cardiology. Was unable to tolerate BB therapy so tries to avoid her common triggers and hydrate well. Has been released from her specialist for some time as she has been doing well. Notes this morning with slight increase in heart rate up to 130 which she feels from working 7 days in a row coupled with poor hydration. Denies chest pain, SOB, dizziness. Notes she hydrated and took a nap, waking up and feeling back to normal. Had to call out of work this morning and requires a note for absence. Denies any symptoms at present.  HPI: HPI  Problems:  Patient Active Problem List   Diagnosis Date Noted   Iron deficiency anemia 03/01/2020   Other chronic pain 07/16/2019   Chronic migraine without aura without status migrainosus, not intractable 09/11/2018    Allergies:  Allergies  Allergen Reactions   Other     Allergic to an ingredient in certain Deoderants and something that is in Cetaphil face wash.  Medications:  Current Outpatient Medications:    albuterol (VENTOLIN HFA) 108 (90 Base) MCG/ACT inhaler, Inhale 1-2 puffs into the lungs every 6 (six) hours as needed for wheezing or shortness of breath., Disp: 18 g, Rfl: 0   DULoxetine (CYMBALTA) 60 MG capsule, TAKE 1 CAPSULE BY MOUTH EVERY DAY, Disp: 90 capsule, Rfl: 0   escitalopram (LEXAPRO) 10 MG tablet, Take 1 tablet by mouth daily., Disp: , Rfl:    ipratropium  (ATROVENT) 0.03 % nasal spray, Place 2 sprays into both nostrils every 12 (twelve) hours., Disp: 30 mL, Rfl: 0   LO LOESTRIN FE 1 MG-10 MCG / 10 MCG tablet, Take 1 tablet by mouth daily., Disp: , Rfl:    promethazine-dextromethorphan (PROMETHAZINE-DM) 6.25-15 MG/5ML syrup, Take 5 mLs by mouth 4 (four) times daily as needed for cough., Disp: 118 mL, Rfl: 0   UBRELVY 100 MG TABS, TAKE 1 TABLET BY MOUTH UP TO TWICE DAILY AS NEEDED FOR MIGRAINE. WAIT AT LEAST 2HRS BETWEEN DOSES, Disp: 10 tablet, Rfl: 2  Observations/Objective: Patient is well-developed, well-nourished in no acute distress.  Resting comfortably at home.  Head is normocephalic, atraumatic.  No labored breathing. Speech is clear and coherent with logical content.  Patient is alert and oriented at baseline.   Assessment and Plan: 1. Dysautonomia (HCC)  Longstanding diagnosis for her. Mild increase in heart rate this morning due to poor sleep, overexertion and poor hydration. Resolved with rest and hydration. Work note for today provided. Will require in person evaluation for any recurrence of symptoms.   Follow Up Instructions: I discussed the assessment and treatment plan with the patient. The patient was provided an opportunity to ask questions and all were answered. The patient agreed with the plan and demonstrated an understanding of the instructions.  A copy of instructions were sent to the patient via MyChart unless otherwise noted below.   The patient was advised to call back or seek an in-person evaluation if the symptoms worsen or if the condition fails to improve as anticipated.  Time:  I spent 10 minutes with the patient via telehealth technology discussing the above problems/concerns.    Cassandra Climes, PA-C

## 2022-12-12 NOTE — Telephone Encounter (Signed)
Returned call to patient,   She states she has had insomnia since she was 10. She states she has been seen by two cardiologists, who told her that her HR issues were not causing sleep issues. She states she woke up this morning and her HR was really high and so she stayed out of work. She states she got a call at 2pm from her manager that she would need clearance from a doctor to come back to work. Advised patient that she has not been seen since 2022 and we can provide her a note at this time. She states she will go to the urgent care.

## 2022-12-12 NOTE — Patient Instructions (Signed)
  Cassandra Ramos, thank you for joining Piedad Climes, PA-C for today's virtual visit.  While this provider is not your primary care provider (PCP), if your PCP is located in our provider database this encounter information will be shared with them immediately following your visit.   A [facility] MyChart account gives you access to today's visit and all your visits, tests, and labs performed at [facility] " click here if you don't have a [facility] MyChart account or go to [email or web address]  Consent: (Patient) Cassandra Ramos provided verbal consent for this virtual visit at the beginning of the encounter.  Current Medications:  Current Outpatient Medications:    albuterol (VENTOLIN HFA) 108 (90 Base) MCG/ACT inhaler, Inhale 1-2 puffs into the lungs every 6 (six) hours as needed for wheezing or shortness of breath., Disp: 18 g, Rfl: 0   DULoxetine (CYMBALTA) 60 MG capsule, TAKE 1 CAPSULE BY MOUTH EVERY DAY, Disp: 90 capsule, Rfl: 0   escitalopram (LEXAPRO) 10 MG tablet, Take 1 tablet by mouth daily., Disp: , Rfl:    ipratropium (ATROVENT) 0.03 % nasal spray, Place 2 sprays into both nostrils every 12 (twelve) hours., Disp: 30 mL, Rfl: 0   LO LOESTRIN FE 1 MG-10 MCG / 10 MCG tablet, Take 1 tablet by mouth daily., Disp: , Rfl:    promethazine-dextromethorphan (PROMETHAZINE-DM) 6.25-15 MG/5ML syrup, Take 5 mLs by mouth 4 (four) times daily as needed for cough., Disp: 118 mL, Rfl: 0   UBRELVY 100 MG TABS, TAKE 1 TABLET BY MOUTH UP TO TWICE DAILY AS NEEDED FOR MIGRAINE. WAIT AT LEAST 2HRS BETWEEN DOSES, Disp: 10 tablet, Rfl: 2   Medications ordered in this encounter:  No orders of the defined types were placed in this encounter.    *If you need refills on other medications prior to your next appointment, please contact your pharmacy*  Follow-Up: Call back or seek an in-person evaluation if the symptoms worsen or if the condition fails to improve as anticipated.  [facility] [phone]  Other Instruction   If you have been instructed to have an in-person evaluation today at a local Urgent Care facility, please use the link below. It will take you to a list of all of our available [facility], including address, phone number and hours of operation. Please do not delay care.  [facility]  If you or a family member do not have a primary care provider, use the link below to schedule a visit and establish care. When you choose a [facility] primary care physician or advanced practice provider, you gain a long-term partner in health. Find a Primary Care Provider  Learn more about [facility]'s in-office and virtual care options:  - Get Care Now

## 2022-12-12 NOTE — Telephone Encounter (Signed)
Pt is requesting a callback regarding her needing a note for work stating she'd been cleared. Please advise

## 2022-12-20 ENCOUNTER — Encounter: Payer: Self-pay | Admitting: Hematology

## 2022-12-20 ENCOUNTER — Telehealth: Payer: Self-pay | Admitting: Pharmacy Technician

## 2022-12-20 ENCOUNTER — Other Ambulatory Visit (HOSPITAL_COMMUNITY): Payer: Self-pay

## 2022-12-20 NOTE — Telephone Encounter (Signed)
Pharmacy Patient Advocate Encounter   Received notification from CoverMyMeds that prior authorization for Ubrelvy 100MG  tablets is required/requested.   Insurance verification completed.   The patient is insured through [employer] .   Per test claim: PA required; PA submitted to [employer] [facility] via CoverMyMeds Key/confirmation #/EOC [id number] Status is pending

## 2022-12-22 ENCOUNTER — Other Ambulatory Visit (HOSPITAL_COMMUNITY): Payer: Self-pay

## 2022-12-22 NOTE — Telephone Encounter (Signed)
Pharmacy Patient Advocate Encounter  Received notification from [employer] [facility] that Prior Authorization for Ubrelvy 100MG  tablets has been APPROVED from 12/20/2022 to 12/19/2023. Ran test claim, Copay is $0. This test claim was processed through [facility]- copay amounts may vary at other pharmacies due to pharmacy/plan contracts, or as the patient moves through the different stages of their insurance plan.   PA #/Case ID/Reference #: PA Case ID #: [id number]

## 2023-01-24 ENCOUNTER — Ambulatory Visit: Payer: BC Managed Care – PPO | Admitting: Adult Health

## 2023-02-04 ENCOUNTER — Other Ambulatory Visit: Payer: Self-pay | Admitting: Adult Health

## 2023-02-05 NOTE — Telephone Encounter (Signed)
Medication flagged for potential interaction with escitalopram.  Patient last seen on 10/20/2021. Follow up appointment scheduled for 09/13/2023.

## 2023-02-20 ENCOUNTER — Encounter: Payer: Self-pay | Admitting: Hematology

## 2023-02-20 ENCOUNTER — Other Ambulatory Visit (HOSPITAL_COMMUNITY): Payer: Self-pay

## 2023-03-28 ENCOUNTER — Telehealth: Payer: BC Managed Care – PPO | Admitting: Physician Assistant

## 2023-03-28 DIAGNOSIS — J069 Acute upper respiratory infection, unspecified: Secondary | ICD-10-CM

## 2023-03-28 MED ORDER — BENZONATATE 100 MG PO CAPS
100.0000 mg | ORAL_CAPSULE | Freq: Three times a day (TID) | ORAL | 0 refills | Status: DC | PRN
Start: 2023-03-28 — End: 2023-05-22

## 2023-03-28 MED ORDER — FLUTICASONE PROPIONATE 50 MCG/ACT NA SUSP: 2.0000 | Freq: Every day | NASAL | 0 refills | Status: DC

## 2023-03-28 NOTE — Progress Notes (Signed)
E-Visit for Upper Respiratory Infection   We are sorry you are not feeling well.  Here is how we plan to help!  Based on what you have shared with me, it looks like you may have a viral upper respiratory infection.  Upper respiratory infections are caused by a large number of viruses; however, rhinovirus is the most common cause.   Symptoms vary from person to person, with common symptoms including sore throat, cough, fatigue or lack of energy and feeling of general discomfort.  A low-grade fever of up to 100.4 may present, but is often uncommon.  Symptoms vary however, and are closely related to a person's age or underlying illnesses.  The most common symptoms associated with an upper respiratory infection are nasal discharge or congestion, cough, sneezing, headache and pressure in the ears and face.  These symptoms usually persist for about 3 to 10 days, but can last up to 2 weeks.  It is important to know that upper respiratory infections do not cause serious illness or complications in most cases.    Upper respiratory infections can be transmitted from person to person, with the most common method of transmission being a person's hands.  The virus is able to live on the skin and can infect other persons for up to 2 hours after direct contact.  Also, these can be transmitted when someone coughs or sneezes; thus, it is important to cover the mouth to reduce this risk.  To keep the spread of the illness at bay, good hand hygiene is very important.  This is an infection that is most likely caused by a virus. There are no specific treatments other than to help you with the symptoms until the infection runs its course.  We are sorry you are not feeling well.  Here is how we plan to help!   For nasal congestion, you may use an oral decongestants such as Mucinex D or if you have glaucoma or high blood pressure use plain Mucinex.  Saline nasal spray or nasal drops can help and can safely be used as often as  needed for congestion.  For your congestion, I have prescribed Fluticasone nasal spray one spray in each nostril twice a day  If you do not have a history of heart disease, hypertension, diabetes or thyroid disease, prostate/bladder issues or glaucoma, you may also use Sudafed to treat nasal congestion.  It is highly recommended that you consult with a pharmacist or your primary care physician to ensure this medication is safe for you to take.     If you have a cough, you may use cough suppressants such as Delsym and Robitussin.  If you have glaucoma or high blood pressure, you can also use Coricidin HBP.   For cough I have prescribed for you A prescription cough medication called Tessalon Perles 100 mg. You may take 1-2 capsules every 8 hours as needed for cough  If you have a sore or scratchy throat, use a saltwater gargle-  to  teaspoon of salt dissolved in a 4-ounce to 8-ounce glass of warm water.  Gargle the solution for approximately 15-30 seconds and then spit.  It is important not to swallow the solution.  You can also use throat lozenges/cough drops and Chloraseptic spray to help with throat pain or discomfort.  Warm or cold liquids can also be helpful in relieving throat pain.  For headache, pain or general discomfort, you can use Ibuprofen or Tylenol as directed.   Some authorities believe  that zinc sprays or the use of Echinacea may shorten the course of your symptoms.  A work note has been provided for you today. It will be available under "letters" in your MyChart account.  HOME CARE Only take medications as instructed by your medical team. Be sure to drink plenty of fluids. Water is fine as well as fruit juices, sodas and electrolyte beverages. You may want to stay away from caffeine or alcohol. If you are nauseated, try taking small sips of liquids. How do you know if you are getting enough fluid? Your urine should be a pale yellow or almost colorless. Get rest. Taking a steamy  shower or using a humidifier may help nasal congestion and ease sore throat pain. You can place a towel over your head and breathe in the steam from hot water coming from a faucet. Using a saline nasal spray works much the same way. Cough drops, hard candies and sore throat lozenges may ease your cough. Avoid close contacts especially the very young and the elderly Cover your mouth if you cough or sneeze Always remember to wash your hands.   GET HELP RIGHT AWAY IF: You develop worsening fever. If your symptoms do not improve within 10 days You develop yellow or green discharge from your nose over 3 days. You have coughing fits You develop a severe head ache or visual changes. You develop shortness of breath, difficulty breathing or start having chest pain Your symptoms persist after you have completed your treatment plan  MAKE SURE YOU  Understand these instructions. Will watch your condition. Will get help right away if you are not doing well or get worse.  Thank you for choosing an e-visit.  Your e-visit answers were reviewed by a board certified advanced clinical practitioner to complete your personal care plan. Depending upon the condition, your plan could have included both over the counter or prescription medications.  Please review your pharmacy choice. Make sure the pharmacy is open so you can pick up prescription now. If there is a problem, you may contact your provider through [employer] and have the prescription routed to another pharmacy.  Your safety is important to [place]. If you have drug allergies check your prescription carefully.   For the next 24 hours you can use MyChart to ask questions about today's visit, request a non-urgent call back, or ask for a work or school excuse. You will get an email in the next two days asking about your experience. I hope that your e-visit has been valuable and will speed your recovery.   I have spent 5 minutes in review of e-visit  questionnaire, review and updating patient chart, medical decision making and response to patient.   Margaretann Loveless, PA-C

## 2023-04-08 ENCOUNTER — Telehealth: Payer: BC Managed Care – PPO | Admitting: Physician Assistant

## 2023-04-08 DIAGNOSIS — J019 Acute sinusitis, unspecified: Secondary | ICD-10-CM

## 2023-04-08 MED ORDER — AMOXICILLIN-POT CLAVULANATE 875-125 MG PO TABS: 1.0000 | ORAL_TABLET | Freq: Two times a day (BID) | ORAL | 0 refills | Status: DC

## 2023-04-08 NOTE — Progress Notes (Signed)

## 2023-04-25 ENCOUNTER — Encounter: Payer: Self-pay | Admitting: Adult Health

## 2023-04-25 ENCOUNTER — Ambulatory Visit (INDEPENDENT_AMBULATORY_CARE_PROVIDER_SITE_OTHER): Payer: BC Managed Care – PPO | Admitting: Adult Health

## 2023-04-25 VITALS — BP 132/87 | HR 106 | Ht 64.0 in | Wt 251.0 lb

## 2023-04-25 DIAGNOSIS — G43709 Chronic migraine without aura, not intractable, without status migrainosus: Secondary | ICD-10-CM | POA: Diagnosis not present

## 2023-04-25 MED ORDER — TIZANIDINE HCL 2 MG PO CAPS: 2.0000 mg | ORAL_CAPSULE | Freq: Every day | ORAL | 5 refills | Status: DC

## 2023-04-25 NOTE — Progress Notes (Signed)
PATIENT: Cassandra Ramos DOB: 11/12/2000  REASON FOR VISIT: follow up HISTORY FROM: patient PRIMARY NEUROLOGIST: Dr. Lucia Gaskins  Chief Complaint  Patient presents with   Follow-up    Patient in room #5 and alone. Patient states she here to follow on her migraines for Rx refills. Patient states she had a few that was very painful but she been doing well.     HISTORY OF PRESENT ILLNESS: Today 04/25/23:  Cassandra Ramos is a 23 y.o. female with a history of Migraine headaches. Returns today for follow-up.  Patient reports that her headaches are typically located in her neck.  She describes a pain sensation at the top of the cervical spine in the center.  This radiates to her shoulders.  She states with certain movements she will have numbness and tingling that goes down the left arm.  Denies photophobia and phonophobia.  Denies nausea or vomiting.  She was taking Bernita Raisin however she has not followed up with our office since 2023 therefore she has been out of this medication.  She states that she has taken a muscle relaxer before.  She states it was tizanidine and it was her mother's.  Reports that she tolerated it well.  MRI of the brain with and without contrast September 20, 2018 IMPRESSION:    MRI brain (with and without) demonstrating: - Minimal periventricular and subcortical non-specific gliosis. - No acute findings  History 10/20/21: Cassandra Ramos is a 23 year old female with a history of migraine headaches. She returns today for follow-up.   In regards to headaches having daily headaches. Reports this month as been worse. Cymbalta is the only thing that helped with her migraines. Using ubrevly with advil and that works to relieve her of her migraines. Reports that she had Cymbalta 60 mg left over and restarted about 1-2 weeks ago and that has been helping with her headaches. Would like refill   Reports that she went off all medication 1 year ago because she felt that the medication was  interacting with each other.  Saw the patient in [place] but I was under the expression she was taking all of her mediation. At that time reported that migraines were under control with just using ubrelvy PRN. Reports that she was diagnosed with fibromyalgia by a rheumatologist at [facility]. Feels that fibromyalgia is getting worse.   04/21/21: Cassandra Ramos is a 23 year old female with a history of migraine headaches.  She states that her headaches have been under relatively good control.  She has approximately 1 headache every 1 to 2 months.  Bernita Raisin with Aleve typically resolves her headaches fairly quickly.  She is asking for a work note today to excuse her from work when she has a migraine.  HISTORY 07/16/2019: She is feeling so much better, she found a great counselor for her pain, She saw a rheumatologist and did not have a good experience, a doctor told her she had fibromyalgia and was started on Cymbalta by psychiatry and she feels great. She is taking Ajovy and doing well. She stopped prozac. She is doing great on 60mg . Seeing a therapist who is very good. Her therapist is Occupational psychologist.Psych Dr. [name]. She is going to try stopping Ajovy. Bernita Raisin works Firefighter.     01/13/2019: Rizatriptan did not work, she had the burning sensation in the back of the head.  Also causes chest tightness and neck pain. Bernita Raisin works. She is on Proxac. It was giving her appetite suppression but not  anymore. 15 headache days a month, 10-12 are severe or moderately severe and migrainous, can last 4 hours - 24 hours usually 4 hours with treatment.      HPI:  Cassandra Ramos is a 23 y.o. female here as requested by Maryellen Pile, MD for headaches and migraines.  Mother is here and provides information. Past medical history of headaches seen in neurology, headache started 2013.  Past medical history of headaches and migraines, dysautonomia, chronic pain syndrome. FHx of hemiplegia migraines. She started getting  headaches in puberty, started 1-2x a week. This past December she started getting massive headaches feeling like a nerve in the back of her neck. She felt her neck was squeezing and forward radiation behind her eye. Severe, she wants to pass out, severe and acute, she will go to the floor. This is new (but similar symptoms in previous notes), this is new, ataxia, acts like she is drunk, she is on the floor, her "eyes roll back". She denies any hemiplegic migraines.In the past she had daily headaches and thinks she was drinking too much caffeine. She does not have daily headaches. Since January, even longer, mother says she has light sensitivity she does not like lights she likes the lights off, she has headaches with pulsating and pounding, no phonophobia, mild nausea, she also has chronic nausea and GI problems. 15 headache days a month, 10-12 are severe or moderately severe and migrainous, can last 4 hours - 24 hours usually 4 hours with treatment, using a headache icepack helps. Movement makes it worse. No inciting events but has a family history, worse at night. Patient emphatically denies any aura after discussion of what an aura is. Headaches are worse with positiion and valsalva, she has had vision changes episodically, she has vertigo.    meds tried: nortriptyline (side effects), propranolol (side effects low pulse), magnesium ineffective, B2 ineffective, Zofran, pedal Dolex, Imitrex(chest tightness).  They declined Effexor and Topamax due to side effects and other family members, also tried steroids   Reviewed notes, labs and imaging from outside physicians, which showed:    Patient was seen several times by neurology Dr. Theora Master.  Headaches for 4 years initially seen August 23, 2015, worsening in the prior 4 months she has missed 30 days of school, head heart and stomach "issues".  History of dysautonomia.  Patient's mother and sister have history of headaches.  Headaches are daily with severe  headaches occurring once a week, severe headaches are behind the left eye inside of the neck and in the apex of the head.  Pain is sharp, throbbing, dull, achy, pressure.  No known trigger.  Has not tried any medications.  Positive for photophobia and nausea.  Physical neurological exam normal.  She was diagnosed with tension headaches and migraines.  She declined several other prescription medications options but mother declined stating side effects and family members to each choice.   She was seen for chronic pain syndrome, they discussed relationship between pain sleep food and exercise.    REVIEW OF SYSTEMS: Out of a complete 14 system review of symptoms, the patient complains only of the following symptoms, and all other reviewed systems are negative.  ALLERGIES: Allergies  Allergen Reactions   Other     Allergic to an ingredient in certain Deoderants and something that is in Cetaphil face wash.     HOME MEDICATIONS: Outpatient Medications Prior to Visit  Medication Sig Dispense Refill   albuterol (VENTOLIN HFA) 108 (90 Base) MCG/ACT inhaler  Inhale 1-2 puffs into the lungs every 6 (six) hours as needed for wheezing or shortness of breath. 18 g 0   amoxicillin-clavulanate (AUGMENTIN) 875-125 MG tablet Take 1 tablet by mouth 2 (two) times daily. 14 tablet 0   benzonatate (TESSALON) 100 MG capsule Take 1-2 capsules (100-200 mg total) by mouth 3 (three) times daily as needed. 30 capsule 0   DULoxetine (CYMBALTA) 60 MG capsule TAKE 1 CAPSULE BY MOUTH EVERY DAY 90 capsule 0   fluticasone (FLONASE) 50 MCG/ACT nasal spray Place 2 sprays into both nostrils daily. 16 g 0   LO LOESTRIN FE 1 MG-10 MCG / 10 MCG tablet Take 1 tablet by mouth daily.     UBRELVY 100 MG TABS TAKE 1 TABLET BY MOUTH UP TO TWICE DAILY AS NEEDED FOR MIGRAINE. WAIT AT LEAST 2HRS BETWEEN DOSES 10 tablet 2   No facility-administered medications prior to visit.    PAST MEDICAL HISTORY: Past Medical History:  Diagnosis Date    Allergy    grass and tree pollen   Chronic pain    Dysautonomia (HCC)    Endometriosis    suspected based on symptoms, runs in family   GERD (gastroesophageal reflux disease)    Herpes    High cholesterol    Migraine     PAST SURGICAL HISTORY: Past Surgical History:  Procedure Laterality Date   adnoidectomy      FAMILY HISTORY: Family History  Problem Relation Age of Onset   Asthma Mother    Endometriosis Mother    Migraines Mother    Hypertension Maternal Aunt    Diabetes Maternal Aunt    High Cholesterol Maternal Aunt    Asthma Maternal Aunt    Asthma Maternal Grandmother    Hypertension Maternal Grandmother    High Cholesterol Maternal Grandmother    Heart disease Maternal Grandmother    Cancer Maternal Grandmother        breast   Lung cancer Paternal Grandmother     SOCIAL HISTORY: Social History   Socioeconomic History   Marital status: Single    Spouse name: Not on file   Number of children: Not on file   Years of education: Not on file   Highest education level: High school graduate  Occupational History   Not on file  Tobacco Use   Smoking status: Never   Smokeless tobacco: Never  Vaping Use   Vaping status: Never Used  Substance and Sexual Activity   Alcohol use: Never   Drug use: Never   Sexual activity: Yes    Birth control/protection: Pill, Condom  Other Topics Concern   Not on file  Social History Narrative   LIves at home with mom, dad, sister, brother   Right handed   Works at [employer]   Caffeine: large coke every day   Social Drivers of Corporate investment banker Strain: Not on file  Food Insecurity: Not on file  Transportation Needs: Not on file  Physical Activity: Not on file  Stress: Not on file  Social Connections: Not on file  Intimate Partner Violence: Not on file      PHYSICAL EXAM  Vitals:   04/25/23 1417  BP: 132/87  Pulse: (!) 106  Weight: 251 lb (113.9 kg)  Height: 5\' 4"  (1.626 m)    Body mass  index is 43.08 kg/m.  Generalized: Well developed, in no acute distress   Neurological examination  Mentation: Alert oriented to time, place, history taking. Follows all commands speech  and language fluent Cranial nerve II-XII: Pupils were equal round reactive to light. Extraocular movements were full, visual field were full on confrontational test. Facial sensation and strength were normal. Uvula tongue midline. Head turning and shoulder shrug  were normal and symmetric. Motor: The motor testing reveals 5 over 5 strength of all 4 extremities. Good symmetric motor tone is noted throughout.  Sensory: Sensory testing is intact to soft touch on all 4 extremities. No evidence of extinction is noted.  Coordination: Cerebellar testing reveals good finger-nose-finger and heel-to-shin bilaterally.  Gait and station: Gait is normal.    DIAGNOSTIC DATA (LABS, IMAGING, TESTING) - I reviewed patient records, labs, notes, testing and imaging myself where available.  Lab Results  Component Value Date   WBC 7.5 05/04/2020   HGB 13.5 05/04/2020   HCT 42.4 05/04/2020   MCV 86.4 05/04/2020   PLT 251 05/04/2020      Component Value Date/Time   NA 137 05/04/2020 0837   NA 143 09/11/2018 0855   K 4.0 05/04/2020 0837   CL 106 05/04/2020 0837   CO2 25 05/04/2020 0837   GLUCOSE 111 (H) 05/04/2020 0837   BUN 10 05/04/2020 0837   BUN 11 09/11/2018 0855   CREATININE 0.93 05/04/2020 0837   CALCIUM 9.2 05/04/2020 0837   PROT 7.4 05/04/2020 0837   PROT 6.8 09/11/2018 0855   ALBUMIN 3.7 05/04/2020 0837   ALBUMIN 4.4 09/11/2018 0855   AST 13 (L) 05/04/2020 0837   ALT 18 05/04/2020 0837   ALKPHOS 73 05/04/2020 0837   BILITOT 0.3 05/04/2020 0837   GFRNONAA >60 05/04/2020 0837   GFRAA 112 09/11/2018 0855    Lab Results  Component Value Date   VITAMINB12 454 05/04/2020   Lab Results  Component Value Date   TSH 2.360 09/11/2018      ASSESSMENT AND PLAN 23 y.o. year old female  has a past  medical history of Allergy, Chronic pain, Dysautonomia (HCC), Endometriosis, GERD (gastroesophageal reflux disease), Herpes, High cholesterol, and Migraine. here with:  1.  Cervicalgia with radicular symptoms  Start tizanidine 2 mg at bedtime.  I reviewed potential side effects and contraindications with the patient.  And provided her information on her AVS. I did advise that this medication can cause drowsiness she should avoid driving if she is sleepy. We discussed potentially doing MRI of the cervical spine.  The patient would like to proceed with this.  I have ordered MRI of the cervical spine with and without contrast to look for nerve root compression, stenosis or other causes that would be contributing to her symptoms. BMP ordered to check kidney function prior to MRI Advised if symptoms worsen or she develops new symptoms she should let us know. FU in 6-7 months or sooner if needed.     Butch Penny, MSN, NP-C 04/25/2023, 2:12 PM [facility] [address] [place], [place] [address] [phone]

## 2023-04-25 NOTE — Patient Instructions (Signed)
Your Plan:  Start Tizanidine 2 mg at bedtime If your symptoms worsen or you develop new symptoms please let us know.   Thank you for coming to see [place] at [facility]. I hope we have been able to provide you high quality care today.  You may receive a patient satisfaction survey over the next few weeks. We would appreciate your feedback and comments so that we may continue to improve ourselves and the health of our patients.

## 2023-04-26 ENCOUNTER — Encounter: Payer: Self-pay | Admitting: Adult Health

## 2023-04-26 LAB — BASIC METABOLIC PANEL
BUN/Creatinine Ratio: 9 (ref 9–23)
BUN: 8 mg/dL (ref 6–20)
CO2: 22 mmol/L (ref 20–29)
Calcium: 8.7 mg/dL (ref 8.7–10.2)
Chloride: 104 mmol/L (ref 96–106)
Creatinine, Ser: 0.86 mg/dL (ref 0.57–1.00)
Glucose: 91 mg/dL (ref 70–99)
Potassium: 3.9 mmol/L (ref 3.5–5.2)
Sodium: 140 mmol/L (ref 134–144)
eGFR: 98 mL/min/{1.73_m2} (ref >59)

## 2023-05-08 ENCOUNTER — Ambulatory Visit (INDEPENDENT_AMBULATORY_CARE_PROVIDER_SITE_OTHER): Payer: BC Managed Care – PPO

## 2023-05-08 ENCOUNTER — Encounter: Payer: Self-pay | Admitting: Adult Health

## 2023-05-08 DIAGNOSIS — G43709 Chronic migraine without aura, not intractable, without status migrainosus: Secondary | ICD-10-CM

## 2023-05-08 MED ORDER — GADOBENATE DIMEGLUMINE 529 MG/ML IV SOLN
20.0000 mL | Freq: Once | INTRAVENOUS | Status: AC | PRN
  Administered 2023-05-08: 20 mL via INTRAVENOUS

## 2023-05-13 ENCOUNTER — Encounter (HOSPITAL_COMMUNITY): Payer: Self-pay | Admitting: Emergency Medicine

## 2023-05-13 ENCOUNTER — Emergency Department (HOSPITAL_COMMUNITY)

## 2023-05-13 ENCOUNTER — Emergency Department (HOSPITAL_COMMUNITY)
Admission: EM | Admit: 2023-05-13 | Discharge: 2023-05-14 | Disposition: A | Discharge status: Home / Self Care | Attending: Emergency Medicine | Admitting: Emergency Medicine

## 2023-05-13 DIAGNOSIS — I10 Essential (primary) hypertension: Secondary | ICD-10-CM | POA: Insufficient documentation

## 2023-05-13 DIAGNOSIS — R109 Unspecified abdominal pain: Secondary | ICD-10-CM | POA: Diagnosis not present

## 2023-05-13 DIAGNOSIS — R0789 Other chest pain: Secondary | ICD-10-CM

## 2023-05-13 DIAGNOSIS — R030 Elevated blood-pressure reading, without diagnosis of hypertension: Secondary | ICD-10-CM

## 2023-05-13 LAB — CBC
HCT: 40.8 % (ref 36.0–46.0)
Hemoglobin: 13 g/dL (ref 12.0–15.0)
MCH: 27.3 pg (ref 26.0–34.0)
MCHC: 31.9 g/dL (ref 30.0–36.0)
MCV: 85.5 fL (ref 80.0–100.0)
Platelets: 381 10*3/uL (ref 150–400)
RBC: 4.77 MIL/uL (ref 3.87–5.11)
RDW: 13 % (ref 11.5–15.5)
WBC: 13.9 10*3/uL — ABNORMAL HIGH (ref 4.0–10.5)
nRBC: 0 % (ref 0.0–0.2)

## 2023-05-13 LAB — BASIC METABOLIC PANEL
Anion gap: 10 (ref 5–15)
BUN: 10 mg/dL (ref 6–20)
CO2: 25 mmol/L (ref 22–32)
Calcium: 9 mg/dL (ref 8.9–10.3)
Chloride: 104 mmol/L (ref 98–111)
Creatinine, Ser: 0.95 mg/dL (ref 0.44–1.00)
GFR, Estimated: >60 mL/min (ref >60)
Glucose, Bld: 110 mg/dL — ABNORMAL HIGH (ref 70–99)
Potassium: 3.5 mmol/L (ref 3.5–5.1)
Sodium: 139 mmol/L (ref 135–145)

## 2023-05-13 LAB — TROPONIN I (HIGH SENSITIVITY): Troponin I (High Sensitivity): 3 ng/L (ref <18)

## 2023-05-13 LAB — HCG, SERUM, QUALITATIVE: Preg, Serum: NEGATIVE

## 2023-05-13 MED ORDER — METOCLOPRAMIDE HCL 5 MG/ML IJ SOLN
10.0000 mg | Freq: Once | INTRAMUSCULAR | Status: AC
  Administered 2023-05-14: 10 mg via INTRAVENOUS
  Filled 2023-05-13: qty 2

## 2023-05-13 MED ORDER — SODIUM CHLORIDE 0.9 % IV BOLUS
1000.0000 mL | Freq: Once | INTRAVENOUS | Status: AC
  Administered 2023-05-14: 1000 mL via INTRAVENOUS

## 2023-05-13 MED ORDER — ONDANSETRON HCL 4 MG/2ML IJ SOLN
4.0000 mg | Freq: Once | INTRAMUSCULAR | Status: AC
  Administered 2023-05-14: 4 mg via INTRAVENOUS
  Filled 2023-05-13: qty 2

## 2023-05-13 MED ORDER — KETOROLAC TROMETHAMINE 15 MG/ML IJ SOLN
15.0000 mg | Freq: Once | INTRAMUSCULAR | Status: AC
  Administered 2023-05-14: 15 mg via INTRAVENOUS
  Filled 2023-05-13: qty 1

## 2023-05-13 MED ORDER — DIPHENHYDRAMINE HCL 50 MG/ML IJ SOLN
25.0000 mg | Freq: Once | INTRAMUSCULAR | Status: AC
  Administered 2023-05-14: 25 mg via INTRAVENOUS
  Filled 2023-05-13: qty 1

## 2023-05-13 NOTE — ED Triage Notes (Signed)
 Pt here from home with c/o htn and chest pain , some slight nausea , no sob

## 2023-05-13 NOTE — ED Provider Notes (Signed)
 [place] [facility]MENT AT [facility] Provider Note   CSN: [id number] Arrival date & time: 05/13/23  2028     History {Add pertinent medical, surgical, social history, OB history to HPI:1} No chief complaint on file.   Cassandra Ramos is a 23 y.o. female.  Patient with a history of chronic pain from endometriosis, PCOS, dysautonomia, GERD.  She presents with elevated blood pressure and chest pain now resolved.  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  States she has worsening of her chronic abdominal pain which makes her blood pressure elevated.  She states that when she checked it on the truck today and was 230/110.  Does not take blood pressure medication normally.  At that time she was having some central chest pain that lasted about an hour that radiated to both shoulders and is since resolved.  No chest pain currently.  Denies shortness of breath, cough or fever.  Has exacerbation of her chronic abdominal pain with multiple signs of nausea and vomiting today.  This is similar to her previous episodes of pain.  No pain with urination or blood in the urine.  She is on her menstrual cycle currently.  Did move her bowels earlier today.  Vomited about 3-4 times.  Headache is gradual onset not thunderclap in origin.  No focal weakness, numbness or tingling.  The history is provided by the patient.       Home Medications Prior to Admission medications   Medication Sig Start Date End Date Taking? Authorizing Provider  amoxicillin-clavulanate (AUGMENTIN) 875-125 MG tablet Take 1 tablet by mouth 2 (two) times daily. Patient not taking: Reported on 04/25/2023 04/08/23   Junie Spencer, FNP  benzonatate (TESSALON) 100 MG capsule Take 1-2 capsules (100-200 mg total) by mouth 3 (three) times daily as needed. Patient not taking: Reported on 04/25/2023 03/28/23   Margaretann Loveless, PA-C  DULoxetine (CYMBALTA) 60 MG capsule TAKE 1 CAPSULE BY MOUTH EVERY DAY 02/05/23    Butch Penny, NP  fluticasone (FLONASE) 50 MCG/ACT nasal spray Place 2 sprays into both nostrils daily. Patient not taking: Reported on 04/25/2023 03/28/23   Margaretann Loveless, PA-C  LO LOESTRIN FE 1 MG-10 MCG / 10 MCG tablet Take 1 tablet by mouth daily. 10/11/22   [provider]  metFORMIN (GLUCOPHAGE-XR) 500 MG 24 hr tablet Take 1 tablet by mouth 2 (two) times daily. 02/22/23   [provider]  mirtazapine (REMERON) 15 MG tablet Take 15 mg by mouth at bedtime.    [provider]  ondansetron (ZOFRAN) 8 MG tablet Take 8 mg by mouth every 8 (eight) hours as needed.    [provider]  tizanidine (ZANAFLEX) 2 MG capsule Take 1 capsule (2 mg total) by mouth at bedtime. 04/25/23   Millikan, Aundra Millet, NP  UBRELVY 100 MG TABS TAKE 1 TABLET BY MOUTH UP TO TWICE DAILY AS NEEDED FOR MIGRAINE. WAIT AT LEAST 2HRS BETWEEN DOSES 11/05/22   Butch Penny, NP      Allergies    Other    Review of Systems   Review of Systems  Constitutional:  Negative for activity change, appetite change, fatigue and fever.  HENT:  Negative for congestion and rhinorrhea.   Respiratory:  Negative for cough and shortness of breath.   Cardiovascular:  Negative for chest pain.  Gastrointestinal:  Positive for abdominal pain, nausea and vomiting.  Genitourinary:  Negative for dysuria and hematuria (.ro).  Musculoskeletal:  Negative for arthralgias and  myalgias.  Skin:  Negative for rash.  Neurological:  Positive for headaches. Negative for weakness.   all other systems are negative except as noted in the HPI and PMH.    Physical Exam Updated Vital Signs BP (!) 157/92 (BP Location: Right Arm)   Pulse (!) 112   Temp 98.1 F (36.7 C)   Resp 19   SpO2 98%  Physical Exam Vitals and nursing note reviewed.  Constitutional:      General: She is not in acute distress.    Appearance: She is well-developed.     Comments: Eating a meal  HENT:     Head: Normocephalic and atraumatic.      Mouth/Throat:     Pharynx: No oropharyngeal exudate.  Eyes:     Conjunctiva/sclera: Conjunctivae normal.     Pupils: Pupils are equal, round, and reactive to light.  Neck:     Comments: No meningismus. Cardiovascular:     Rate and Rhythm: Normal rate and regular rhythm.     Heart sounds: Normal heart sounds. No murmur heard. Pulmonary:     Effort: Pulmonary effort is normal. No respiratory distress.     Breath sounds: Normal breath sounds.  Abdominal:     Palpations: Abdomen is soft.     Tenderness: There is abdominal tenderness. There is no guarding or rebound.  Musculoskeletal:        General: No tenderness. Normal range of motion.     Cervical back: Normal range of motion and neck supple.  Skin:    General: Skin is warm.  Neurological:     Mental Status: She is alert and oriented to person, place, and time.     Cranial Nerves: No cranial nerve deficit.     Motor: No abnormal muscle tone.     Coordination: Coordination normal.     Comments:  5/5 strength throughout. CN 2-12 intact.Equal grip strength.   Psychiatric:        Behavior: Behavior normal.     ED Results / Procedures / Treatments   Labs (all labs ordered are listed, but only abnormal results are displayed) Labs Reviewed  BASIC METABOLIC PANEL - Abnormal; Notable for the following components:      Result Value   Glucose, Bld 110 (*)    All other components within normal limits  CBC - Abnormal; Notable for the following components:   WBC 13.9 (*)    All other components within normal limits  HCG, SERUM, QUALITATIVE  D-DIMER, QUANTITATIVE  URINALYSIS, ROUTINE W REFLEX MICROSCOPIC  HEPATIC FUNCTION PANEL  LIPASE, BLOOD  TROPONIN I (HIGH SENSITIVITY)  TROPONIN I (HIGH SENSITIVITY)    EKG EKG Interpretation Date/Time:  Sunday May 13 2023 20:39:37 EST Ventricular Rate:  92 PR Interval:  146 QRS Duration:  80 QT Interval:  352 QTC Calculation: 435 R Axis:   77  Text Interpretation: Normal sinus  rhythm Nonspecific T wave abnormality Abnormal ECG When compared with ECG of 01-May-2012 19:37, PREVIOUS ECG IS PRESENT Nonspecific T wave abnormality Confirmed by Glynn Octave [phone]) on 05/13/2023 10:49:06 PM  Radiology No results found.  Procedures Procedures  {Document cardiac monitor, telemetry assessment procedure when appropriate:1}  Medications Ordered in ED Medications  sodium chloride 0.9 % bolus 1,000 mL (has no administration in time range)  ondansetron (ZOFRAN) injection 4 mg (has no administration in time range)  metoCLOPramide (REGLAN) injection 10 mg (has no administration in time range)  diphenhydrAMINE (BENADRYL) injection 25 mg (has no administration in time range)  ketorolac (TORADOL) 15 MG/ML injection 15 mg (has no administration in time range)    ED Course/ Medical Decision Making/ A&P   {   Click here for ABCD2, HEART and other calculatorsREFRESH Note before signing :1}                              Medical Decision Making Amount and/or Complexity of Data Reviewed Labs: ordered. Decision-making details documented in ED Course. Radiology: ordered and independent interpretation performed. Decision-making details documented in ED Course. ECG/medicine tests: ordered and independent interpretation performed. Decision-making details documented in ED Course.  Risk Prescription drug management.   Elevated blood pressure with episode of chest pain now resolved.  Hypertensive 157/92 on arrival.  EKG with nonspecific T wave changes, no acute ischemia.  Low suspicion for ACS, PE, aortic dissection.  {Document critical care time when appropriate:1} {Document review of labs and clinical decision tools ie heart score, Chads2Vasc2 etc:1}  {Document your independent review of radiology images, and any outside records:1} {Document your discussion with family members, caretakers, and with consultants:1} {Document social determinants of health affecting pt's  care:1} {Document your decision making why or why not admission, treatments were needed:1} Final Clinical Impression(s) / ED Diagnoses Final diagnoses:  None    Rx / DC Orders ED Discharge Orders     None

## 2023-05-13 NOTE — ED Notes (Signed)
 Pt eating in the hall, no distress noted

## 2023-05-14 ENCOUNTER — Emergency Department (HOSPITAL_COMMUNITY)

## 2023-05-14 LAB — HEPATIC FUNCTION PANEL
ALT: 16 U/L (ref 0–44)
AST: 18 U/L (ref 15–41)
Albumin: 3.2 g/dL — ABNORMAL LOW (ref 3.5–5.0)
Alkaline Phosphatase: 83 U/L (ref 38–126)
Bilirubin, Direct: <0.1 mg/dL (ref 0.0–0.2)
Total Bilirubin: 0.4 mg/dL (ref 0.0–1.2)
Total Protein: 6.5 g/dL (ref 6.5–8.1)

## 2023-05-14 LAB — TROPONIN I (HIGH SENSITIVITY): Troponin I (High Sensitivity): <2 ng/L (ref <18)

## 2023-05-14 LAB — LIPASE, BLOOD: Lipase: 27 U/L (ref 11–51)

## 2023-05-14 LAB — D-DIMER, QUANTITATIVE: D-Dimer, Quant: <0.27 ug{FEU}/mL (ref 0.00–0.50)

## 2023-05-14 MED ORDER — IOHEXOL 350 MG/ML SOLN
75.0000 mL | Freq: Once | INTRAVENOUS | Status: AC | PRN
  Administered 2023-05-14: 75 mL via INTRAVENOUS

## 2023-05-14 NOTE — Discharge Instructions (Addendum)
 Your testing is reassuring.  No evidence of heart attack or blood clot in the lung.  Keep a record of your blood pressure and follow-up with your primary doctor for blood pressure medication adjustments.  Return to the ED if exertional chest pain, pain associate shortness of breath, nausea, vomit, sweating or other concerns.

## 2023-05-14 NOTE — ED Notes (Signed)
 Introduced myself to patient. Would like to hold off on wearing the BP cuff at this time. Patients family is at bedside. Denies pain. States she is sleepy.

## 2023-05-14 NOTE — ED Notes (Signed)
 Patient was made aware that we needed a urine sample. Patient was given something to drink.

## 2023-05-14 NOTE — ED Notes (Signed)
 Back from CT

## 2023-05-14 NOTE — ED Notes (Signed)
Patient was given water to drink. Patient is tolerating it well.

## 2023-05-22 ENCOUNTER — Telehealth: Admitting: Physician Assistant

## 2023-05-22 DIAGNOSIS — A084 Viral intestinal infection, unspecified: Secondary | ICD-10-CM | POA: Diagnosis not present

## 2023-05-22 MED ORDER — ONDANSETRON 4 MG PO TBDP: 4.0000 mg | ORAL_TABLET | Freq: Three times a day (TID) | ORAL | 0 refills | Status: AC | PRN

## 2023-05-22 NOTE — Progress Notes (Signed)
 E-Visit for Diarrhea  We are sorry that you are not feeling well.  Here is how we plan to help!  Based on what you have shared with me it looks like you have Acute Infectious Diarrhea.  Most cases of acute diarrhea are due to infections with virus and bacteria and are self-limited conditions lasting less than 14 days.  For your symptoms you may take Imodium 2 mg tablets that are over the counter at your local pharmacy. Take two tablet now and then one after each loose stool up to 6 a day.  Antibiotics are not needed for most people with diarrhea.  I have sent in a dissolvable Zofran 4 mg tablet to use as directed. This way you do not have to worry about throwing it up and the medication can get into your system.   HOME CARE We recommend changing your diet to help with your symptoms for the next few days. Drink plenty of fluids that contain water salt and sugar. Sports drinks such as Gatorade may help.  You may try broths, soups, bananas, applesauce, soft breads, mashed potatoes or crackers.  You are considered infectious for as long as the diarrhea continues. Hand washing or use of alcohol based hand sanitizers is recommend. It is best to stay out of work or school until your symptoms stop.   GET HELP RIGHT AWAY If you have dark yellow colored urine or do not pass urine frequently you should drink more fluids.   If your symptoms worsen  If you feel like you are going to pass out (faint) You have a new problem  MAKE SURE YOU  Understand these instructions. Will watch your condition. Will get help right away if you are not doing well or get worse.  Thank you for choosing an e-visit.  Your e-visit answers were reviewed by a board certified advanced clinical practitioner to complete your personal care plan. Depending upon the condition, your plan could have included both over the counter or prescription medications.  Please review your pharmacy choice. Make sure the pharmacy is open  so you can pick up prescription now. If there is a problem, you may contact your provider through [employer] and have the prescription routed to another pharmacy.  Your safety is important to [place]. If you have drug allergies check your prescription carefully.   For the next 24 hours you can use MyChart to ask questions about today's visit, request a non-urgent call back, or ask for a work or school excuse. You will get an email in the next two days asking about your experience. I hope that your e-visit has been valuable and will speed your recovery.

## 2023-05-22 NOTE — Progress Notes (Signed)
 I have spent 5 minutes in review of e-visit questionnaire, review and updating patient chart, medical decision making and response to patient.   Piedad Climes, PA-C

## 2023-05-30 ENCOUNTER — Encounter: Payer: Self-pay | Admitting: Internal Medicine

## 2023-06-18 ENCOUNTER — Telehealth: Admitting: Physician Assistant

## 2023-06-18 DIAGNOSIS — B3731 Acute candidiasis of vulva and vagina: Secondary | ICD-10-CM

## 2023-06-18 DIAGNOSIS — R3989 Other symptoms and signs involving the genitourinary system: Secondary | ICD-10-CM

## 2023-06-18 MED ORDER — FLUCONAZOLE 150 MG PO TABS: 150.0000 mg | ORAL_TABLET | ORAL | 0 refills | Status: DC | PRN

## 2023-06-18 MED ORDER — SULFAMETHOXAZOLE-TRIMETHOPRIM 800-160 MG PO TABS: 1.0000 | ORAL_TABLET | Freq: Two times a day (BID) | ORAL | 0 refills | Status: DC

## 2023-06-18 NOTE — Progress Notes (Signed)
 E-Visit for Urinary Problems  We are sorry that you are not feeling well.  Here is how we plan to help!  Based on what you shared with me it looks like you most likely have a simple urinary tract infection.  A UTI (Urinary Tract Infection) is a bacterial infection of the bladder.  Most cases of urinary tract infections are simple to treat but a key part of your care is to encourage you to drink plenty of fluids and watch your symptoms carefully.  I have prescribed Bactrim DS One tablet twice a day for 5 days.  Your symptoms should gradually improve. Call us if the burning in your urine worsens, you develop worsening fever, back pain or pelvic pain or if your symptoms do not resolve after completing the antibiotic.  Urinary tract infections can be prevented by drinking plenty of water to keep your body hydrated.  Also be sure when you wipe, wipe from front to back and don't hold it in!  If possible, empty your bladder every 4 hours.  Diflucan given as prophylaxis as patient tends to get vaginal yeast infections with antibiotic use.  HOME CARE Drink plenty of fluids Compete the full course of the antibiotics even if the symptoms resolve Remember, when you need to go.go. Holding in your urine can increase the likelihood of getting a UTI! GET HELP RIGHT AWAY IF: You cannot urinate You get a high fever Worsening back pain occurs You see blood in your urine You feel sick to your stomach or throw up You feel like you are going to pass out  MAKE SURE YOU  Understand these instructions. Will watch your condition. Will get help right away if you are not doing well or get worse.   Thank you for choosing an e-visit.  Your e-visit answers were reviewed by a board certified advanced clinical practitioner to complete your personal care plan. Depending upon the condition, your plan could have included both over the counter or prescription medications.  Please review your pharmacy choice. Make  sure the pharmacy is open so you can pick up prescription now. If there is a problem, you may contact your provider through [employer] and have the prescription routed to another pharmacy.  Your safety is important to [place]. If you have drug allergies check your prescription carefully.   For the next 24 hours you can use MyChart to ask questions about today's visit, request a non-urgent call back, or ask for a work or school excuse. You will get an email in the next two days asking about your experience. I hope that your e-visit has been valuable and will speed your recovery.   I have spent 5 minutes in review of e-visit questionnaire, review and updating patient chart, medical decision making and response to patient.   Margaretann Loveless, PA-C

## 2023-06-30 ENCOUNTER — Emergency Department (HOSPITAL_COMMUNITY)

## 2023-06-30 ENCOUNTER — Emergency Department (HOSPITAL_COMMUNITY)
Admission: EM | Admit: 2023-06-30 | Discharge: 2023-06-30 | Disposition: A | Discharge status: Home / Self Care | Attending: Emergency Medicine | Admitting: Emergency Medicine

## 2023-06-30 ENCOUNTER — Encounter (HOSPITAL_COMMUNITY): Payer: Self-pay

## 2023-06-30 ENCOUNTER — Other Ambulatory Visit: Payer: Self-pay

## 2023-06-30 DIAGNOSIS — B9689 Other specified bacterial agents as the cause of diseases classified elsewhere: Secondary | ICD-10-CM | POA: Diagnosis not present

## 2023-06-30 DIAGNOSIS — N390 Urinary tract infection, site not specified: Secondary | ICD-10-CM

## 2023-06-30 DIAGNOSIS — R109 Unspecified abdominal pain: Secondary | ICD-10-CM | POA: Diagnosis present

## 2023-06-30 DIAGNOSIS — R1084 Generalized abdominal pain: Secondary | ICD-10-CM

## 2023-06-30 LAB — COMPREHENSIVE METABOLIC PANEL WITH GFR
ALT: 25 U/L (ref 0–44)
AST: 25 U/L (ref 15–41)
Albumin: 3.8 g/dL (ref 3.5–5.0)
Alkaline Phosphatase: 66 U/L (ref 38–126)
Anion gap: 12 (ref 5–15)
BUN: 10 mg/dL (ref 6–20)
CO2: 21 mmol/L — ABNORMAL LOW (ref 22–32)
Calcium: 8.9 mg/dL (ref 8.9–10.3)
Chloride: 105 mmol/L (ref 98–111)
Creatinine, Ser: 0.95 mg/dL (ref 0.44–1.00)
GFR, Estimated: >60 mL/min (ref >60)
Glucose, Bld: 98 mg/dL (ref 70–99)
Potassium: 4 mmol/L (ref 3.5–5.1)
Sodium: 138 mmol/L (ref 135–145)
Total Bilirubin: 0.9 mg/dL (ref 0.0–1.2)
Total Protein: 7.6 g/dL (ref 6.5–8.1)

## 2023-06-30 LAB — CBC WITH DIFFERENTIAL/PLATELET
Abs Immature Granulocytes: 0.04 10*3/uL (ref 0.00–0.07)
Basophils Absolute: 0 10*3/uL (ref 0.0–0.1)
Basophils Relative: 0 %
Eosinophils Absolute: 0 10*3/uL (ref 0.0–0.5)
Eosinophils Relative: 0 %
HCT: 41.2 % (ref 36.0–46.0)
Hemoglobin: 13.2 g/dL (ref 12.0–15.0)
Immature Granulocytes: 0 %
Lymphocytes Relative: 28 %
Lymphs Abs: 3.3 10*3/uL (ref 0.7–4.0)
MCH: 27.3 pg (ref 26.0–34.0)
MCHC: 32 g/dL (ref 30.0–36.0)
MCV: 85.1 fL (ref 80.0–100.0)
Monocytes Absolute: 0.5 10*3/uL (ref 0.1–1.0)
Monocytes Relative: 5 %
Neutro Abs: 7.8 10*3/uL — ABNORMAL HIGH (ref 1.7–7.7)
Neutrophils Relative %: 67 %
Platelets: 361 10*3/uL (ref 150–400)
RBC: 4.84 MIL/uL (ref 3.87–5.11)
RDW: 12.9 % (ref 11.5–15.5)
WBC: 11.7 10*3/uL — ABNORMAL HIGH (ref 4.0–10.5)
nRBC: 0 % (ref 0.0–0.2)

## 2023-06-30 LAB — URINALYSIS, ROUTINE W REFLEX MICROSCOPIC
Bilirubin Urine: NEGATIVE
Glucose, UA: NEGATIVE mg/dL
Hgb urine dipstick: NEGATIVE
Ketones, ur: 5 mg/dL — AB
Nitrite: NEGATIVE
Protein, ur: 100 mg/dL — AB
Specific Gravity, Urine: 1.032 — ABNORMAL HIGH (ref 1.005–1.030)
pH: 5 (ref 5.0–8.0)

## 2023-06-30 LAB — LIPASE, BLOOD: Lipase: 32 U/L (ref 11–51)

## 2023-06-30 LAB — POC URINE PREG, ED: Preg Test, Ur: NEGATIVE

## 2023-06-30 LAB — PREGNANCY, URINE: Preg Test, Ur: NEGATIVE

## 2023-06-30 MED ORDER — CEPHALEXIN 500 MG PO CAPS: 500.0000 mg | ORAL_CAPSULE | Freq: Four times a day (QID) | ORAL | 0 refills | Status: DC

## 2023-06-30 MED ORDER — FENTANYL CITRATE PF 50 MCG/ML IJ SOSY
25.0000 ug | PREFILLED_SYRINGE | Freq: Once | INTRAMUSCULAR | Status: AC
  Administered 2023-06-30: 25 ug via INTRAVENOUS
  Filled 2023-06-30 (×2): qty 1

## 2023-06-30 MED ORDER — IOHEXOL 350 MG/ML SOLN
75.0000 mL | Freq: Once | INTRAVENOUS | Status: AC | PRN
  Administered 2023-06-30: 75 mL via INTRAVENOUS

## 2023-06-30 NOTE — ED Triage Notes (Signed)
 Pt c.o 1 month of abd pain, n/v. Pt seen at PCP several times for the same, pt was on a course of abx and finished 2 days ago. States her pain came back today.

## 2023-06-30 NOTE — ED Provider Triage Note (Signed)
 Emergency Medicine Provider Triage Evaluation Note  Cassandra Ramos , a 23 y.o. female  was evaluated in triage.  Pt complains of abdominal pain, vomiting, diarrhea.  Patient reportedly has a history of endometriosis and dysautonomia and has been on a course of antibiotics by primary care provider for prolonged course of diarrhea off and on for the last month.  She reportedly has a colonoscopy scheduled for the next 4 weeks with GI but she has not been seen by GI yet.  She denies any obvious hematemesis or hematochezia.  No melanotic stools.  Does report that she has previously had surgery to remove endometriosis that was wrapped on her bowel.  No recent fever, chills or bodyaches.  Review of Systems  Positive: As above Negative: As above  Physical Exam  BP (!) 139/98   Pulse (!) 120   Temp 97.9 F (36.6 C)   Resp 16   SpO2 99%  Gen:   Awake, visibly uncomfortable Resp:  Normal effort  MSK:   Moves extremities without difficulty  Other:  Generalized tenderness to palpation in the abdomen, normal bowel sounds  Medical Decision Making  Medically screening exam initiated at 1:03 PM.  Appropriate orders placed.  Cassandra Ramos was informed that the remainder of the evaluation will be completed by another provider, this initial triage assessment does not replace that evaluation, and the importance of remaining in the ED until their evaluation is complete.     Anastasya Jewell A, PA-C 06/30/23 1304

## 2023-06-30 NOTE — ED Notes (Signed)
 Unable to obtain Iv access in triage, IV team consulted

## 2023-06-30 NOTE — Discharge Instructions (Addendum)
 Discuss Ozempic with your Physician.  Follow up with your Physician for recheck.  Your Urine culture is pending

## 2023-07-01 NOTE — ED Provider Notes (Signed)
 [place] EMERGENCY DEPARTMENT AT [facility] Provider Note   CSN: [id number] Arrival date & time: 06/30/23  1247     History  Chief Complaint  Patient presents with   Abdominal Pain   Diarrhea   Emesis    Cassandra Ramos is a 23 y.o. female.  Patient complains of abdominal pain.  Patient reports that she has been seen by her primary care doctor and treated with an antibiotic.  Patient states that she has had abdominal discomfort for the past month.  Patient reports that her doctor tried her on antibiotics to see if this helped with the symptoms.  Patient complains of having belching that tasted like sulfur.  Patient reports that she has lower abdominal pain that feels like the discomfort she has had from endometriosis in the past.  Patient reports that she had surgery by Dr. Asencion Blacksmith for endometriosis.  Patient reports her primary care physician has advised her that she needs to be seen by a gastroenterologist.  Patient also reports she is on Ozempic however she was having the abdominal discomfort before starting Ozempic so that she does not think it is the medication.  Patient reports her symptoms improved while she was taking Bactrim .  Her physician called her in an additional 2 days of Bactrim  today.  The history is provided by the patient and a significant other.  Abdominal Pain Pain location:  Generalized Pain quality: aching and bloating   Pain radiates to:  Does not radiate Pain severity:  Moderate Onset quality:  Gradual Timing:  Constant Progression:  Worsening Chronicity:  New Context: previous surgery   Context: not diet changes, not laxative use, not sick contacts and not suspicious food intake   Relieved by:  Nothing Worsened by:  Nothing Associated symptoms: diarrhea and vomiting   Diarrhea Associated symptoms: abdominal pain and vomiting   Emesis Associated symptoms: abdominal pain and diarrhea        Home Medications Prior to Admission medications    Medication Sig Start Date End Date Taking? Authorizing Provider  cephALEXin  (KEFLEX ) 500 MG capsule Take 1 capsule (500 mg total) by mouth 4 (four) times daily. 06/30/23  Yes Jordin Vicencio K, PA-C  DULoxetine  (CYMBALTA ) 60 MG capsule TAKE 1 CAPSULE BY MOUTH EVERY DAY 02/05/23  Yes Millikan, Megan, NP  ondansetron  (ZOFRAN -ODT) 4 MG disintegrating tablet Take 1 tablet (4 mg total) by mouth every 8 (eight) hours as needed for nausea or vomiting. 05/22/23  Yes Farris Hong, PA-C  SEMAGLUTIDE PO Take 20 Units by mouth once a week.   Yes [provider]  tizanidine  (ZANAFLEX ) 2 MG capsule Take 1 capsule (2 mg total) by mouth at bedtime. 04/25/23  Yes Millikan, Megan, NP  fluconazole  (DIFLUCAN ) 150 MG tablet Take 1 tablet (150 mg total) by mouth every 3 (three) days as needed. Patient not taking: Reported on 06/30/2023 06/18/23   Angelia Kelp, PA-C  sulfamethoxazole -trimethoprim  (BACTRIM  DS) 800-160 MG tablet Take 1 tablet by mouth 2 (two) times daily. Patient not taking: Reported on 06/30/2023 06/18/23   Angelia Kelp, PA-C  UBRELVY  100 MG TABS TAKE 1 TABLET BY MOUTH UP TO TWICE DAILY AS NEEDED FOR MIGRAINE. WAIT AT LEAST 2HRS BETWEEN DOSES Patient not taking: Reported on 06/30/2023 11/05/22   Clem Currier, NP      Allergies    Other and Reglan  [metoclopramide ]    Review of Systems   Review of Systems  Gastrointestinal:  Positive for abdominal pain, diarrhea and vomiting.  All  other systems reviewed and are negative.   Physical Exam Updated Vital Signs BP 123/82   Pulse 95   Temp 98 F (36.7 C) (Oral)   Resp 18   SpO2 98%  Physical Exam Vitals and nursing note reviewed.  Constitutional:      Appearance: She is well-developed.  HENT:     Head: Normocephalic.  Cardiovascular:     Rate and Rhythm: Normal rate.  Pulmonary:     Effort: Pulmonary effort is normal.  Abdominal:     General: Bowel sounds are normal. There is no distension.     Palpations: Abdomen is  soft.     Tenderness: There is abdominal tenderness in the right upper quadrant, right lower quadrant and left lower quadrant.  Musculoskeletal:        General: Normal range of motion.     Cervical back: Normal range of motion.  Skin:    General: Skin is warm.  Neurological:     Mental Status: She is alert and oriented to person, place, and time.     ED Results / Procedures / Treatments   Labs (all labs ordered are listed, but only abnormal results are displayed) Labs Reviewed  CBC WITH DIFFERENTIAL/PLATELET - Abnormal; Notable for the following components:      Result Value   WBC 11.7 (*)    Neutro Abs 7.8 (*)    All other components within normal limits  COMPREHENSIVE METABOLIC PANEL WITH GFR - Abnormal; Notable for the following components:   CO2 21 (*)    All other components within normal limits  URINALYSIS, ROUTINE W REFLEX MICROSCOPIC - Abnormal; Notable for the following components:   Color, Urine AMBER (*)    APPearance TURBID (*)    Specific Gravity, Urine 1.032 (*)    Ketones, ur 5 (*)    Protein, ur 100 (*)    Leukocytes,Ua MODERATE (*)    Bacteria, UA RARE (*)    All other components within normal limits  URINE CULTURE  LIPASE, BLOOD  PREGNANCY, URINE  POC URINE PREG, ED    EKG None  Radiology CT ABDOMEN PELVIS W CONTRAST Result Date: 06/30/2023 CLINICAL DATA:  Abdominal pain, acute, nonlocalized EXAM: CT ABDOMEN AND PELVIS WITH CONTRAST TECHNIQUE: Multidetector CT imaging of the abdomen and pelvis was performed using the standard protocol following bolus administration of intravenous contrast. RADIATION DOSE REDUCTION: This exam was performed according to the departmental dose-optimization program which includes automated exposure control, adjustment of the mA and/or kV according to patient size and/or use of iterative reconstruction technique. CONTRAST:  75mL OMNIPAQUE  IOHEXOL  350 MG/ML SOLN COMPARISON:  05/14/2023 FINDINGS: Lower chest: No pleural or  pericardial effusion. Visualized lung bases clear. Hepatobiliary: No focal liver abnormality is seen. No gallstones, gallbladder wall thickening, or biliary dilatation. Pancreas: Unremarkable. No pancreatic ductal dilatation or surrounding inflammatory changes. Spleen: Borderline splenomegaly, 13.4 cm length, without focal lesion. Adrenals/Urinary Tract: Adrenal glands are unremarkable. Kidneys are normal, without renal calculi, focal lesion, or hydronephrosis. Bladder is unremarkable. Stomach/Bowel: Stomach is within normal limits. Appendix appears normal. No evidence of bowel wall thickening, distention, or inflammatory changes. Vascular/Lymphatic: No significant vascular findings are present. No enlarged abdominal or pelvic lymph nodes. Reproductive: Uterus and bilateral adnexa are unremarkable. Involution of previously noted right adnexal cyst. Other: No ascites.  No free air. Musculoskeletal: No acute or significant osseous findings. IMPRESSION: 1. No acute findings. Electronically Signed   By: Nicoletta Barrier M.D.   On: 06/30/2023 19:15    Procedures  Procedures    Medications Ordered in ED Medications  fentaNYL  (SUBLIMAZE ) injection 25 mcg (25 mcg Intravenous Given 06/30/23 1457)  iohexol  (OMNIPAQUE ) 350 MG/ML injection 75 mL (75 mLs Intravenous Contrast Given 06/30/23 1749)    ED Course/ Medical Decision Making/ A&P                                 Medical Decision Making Pt complains of abdominal pain for the past 4 weeks.    Amount and/or Complexity of Data Reviewed Independent Historian: friend    Details: Pt is here with significant other  Labs: ordered. Decision-making details documented in ED Course.    Details: Labs ordered reviewed and interpreted white blood cell count is 11.7.  Chemistry is normal.  UA shows moderate leukocytes 11-20 white blood cells and 11-20 red blood cells Radiology: ordered and independent interpretation performed. Decision-making details documented in ED  Course.    Details: CT abdomen and pelvis show no evidence of acute findings.  Specifically appendix is normal, kidneys are normal no bowel inflammation or thickening.  Risk Prescription drug management. Risk Details: Patient counseled on results.  Patient may have a UTI.  I will try treating her with a course of Keflex .  Patient is advised she should follow-up with GI as planned and follow-up with Dr. Asencion Blacksmith gynecologist as she has had the history of endometriosis causing pain.  Patient is discharged in stable condition.           Final Clinical Impression(s) / ED Diagnoses Final diagnoses:  Generalized abdominal pain  Urinary tract infection without hematuria, site unspecified    Rx / DC Orders ED Discharge Orders          Ordered    cephALEXin  (KEFLEX ) 500 MG capsule  4 times daily        06/30/23 2049           An After Visit Summary was printed and given to the patient.    Sandi Crosby, PA-C 07/01/23 1528    Lowery Rue, DO 07/01/23 1739

## 2023-07-02 LAB — URINE CULTURE

## 2023-07-25 ENCOUNTER — Encounter: Payer: Self-pay | Admitting: Internal Medicine

## 2023-07-28 ENCOUNTER — Encounter (HOSPITAL_COMMUNITY): Payer: Self-pay | Admitting: Emergency Medicine

## 2023-07-28 ENCOUNTER — Emergency Department (HOSPITAL_COMMUNITY)
Admission: EM | Admit: 2023-07-28 | Discharge: 2023-07-28 | Disposition: A | Discharge status: Home / Self Care | Attending: Emergency Medicine | Admitting: Emergency Medicine

## 2023-07-28 ENCOUNTER — Other Ambulatory Visit: Payer: Self-pay

## 2023-07-28 ENCOUNTER — Emergency Department (HOSPITAL_COMMUNITY)

## 2023-07-28 DIAGNOSIS — R102 Pelvic and perineal pain: Secondary | ICD-10-CM | POA: Diagnosis not present

## 2023-07-28 DIAGNOSIS — R1031 Right lower quadrant pain: Secondary | ICD-10-CM | POA: Diagnosis present

## 2023-07-28 LAB — CBC
HCT: 40.8 % (ref 36.0–46.0)
Hemoglobin: 13.2 g/dL (ref 12.0–15.0)
MCH: 26.5 pg (ref 26.0–34.0)
MCHC: 32.4 g/dL (ref 30.0–36.0)
MCV: 81.9 fL (ref 80.0–100.0)
Platelets: 375 10*3/uL (ref 150–400)
RBC: 4.98 MIL/uL (ref 3.87–5.11)
RDW: 12.9 % (ref 11.5–15.5)
WBC: 11.9 10*3/uL — ABNORMAL HIGH (ref 4.0–10.5)
nRBC: 0 % (ref 0.0–0.2)

## 2023-07-28 LAB — COMPREHENSIVE METABOLIC PANEL WITH GFR
ALT: 25 U/L (ref 0–44)
AST: 22 U/L (ref 15–41)
Albumin: 3.9 g/dL (ref 3.5–5.0)
Alkaline Phosphatase: 69 U/L (ref 38–126)
Anion gap: 10 (ref 5–15)
BUN: 8 mg/dL (ref 6–20)
CO2: 22 mmol/L (ref 22–32)
Calcium: 8.9 mg/dL (ref 8.9–10.3)
Chloride: 104 mmol/L (ref 98–111)
Creatinine, Ser: 1.06 mg/dL — ABNORMAL HIGH (ref 0.44–1.00)
GFR, Estimated: >60 mL/min (ref >60)
Glucose, Bld: 111 mg/dL — ABNORMAL HIGH (ref 70–99)
Potassium: 3.4 mmol/L — ABNORMAL LOW (ref 3.5–5.1)
Sodium: 136 mmol/L (ref 135–145)
Total Bilirubin: 0.8 mg/dL (ref 0.0–1.2)
Total Protein: 7.4 g/dL (ref 6.5–8.1)

## 2023-07-28 LAB — URINALYSIS, ROUTINE W REFLEX MICROSCOPIC
Bilirubin Urine: NEGATIVE
Glucose, UA: NEGATIVE mg/dL
Ketones, ur: NEGATIVE mg/dL
Nitrite: NEGATIVE
Protein, ur: 30 mg/dL — AB
Specific Gravity, Urine: 1.027 (ref 1.005–1.030)
pH: 5 (ref 5.0–8.0)

## 2023-07-28 LAB — LIPASE, BLOOD: Lipase: 35 U/L (ref 11–51)

## 2023-07-28 LAB — HCG, SERUM, QUALITATIVE: Preg, Serum: NEGATIVE

## 2023-07-28 MED ORDER — SODIUM CHLORIDE 0.9 % IV BOLUS
1000.0000 mL | Freq: Once | INTRAVENOUS | Status: AC
  Administered 2023-07-28: 1000 mL via INTRAVENOUS

## 2023-07-28 MED ORDER — MORPHINE SULFATE (PF) 4 MG/ML IV SOLN
4.0000 mg | Freq: Once | INTRAVENOUS | Status: AC
  Administered 2023-07-28: 4 mg via INTRAVENOUS
  Filled 2023-07-28: qty 1

## 2023-07-28 NOTE — Discharge Instructions (Addendum)
 Follow up with your gynecologist next week for evaluation.  Try ibuprofen for discomfort.

## 2023-07-28 NOTE — ED Triage Notes (Signed)
 Pt reports having IUD placed a week ago. Seen at OBGYN 2 days ago due to extreme pain and told her cyst ruptured. Pain returned today and then reports 2 syncopal events due to pain.

## 2023-07-28 NOTE — ED Provider Notes (Signed)
 [place] EMERGENCY DEPARTMENT AT [facility] Provider Note   CSN: [id number] Arrival date & time: 07/28/23  1642     History  Chief Complaint  Patient presents with   Pelvic Pain    Cassandra Ramos is a 23 y.o. female.  Patient reports that she has right lower quadrant abdominal pain.  Patient was diagnosed with a right-sided ovarian cyst.  Patient reports she has a history of polycystic ovarian disease.  Patient reports she recently saw her gynecologist who felt that she had ruptured the ovarian cyst.  Patient reports she feels like she is having contractions.  Patient reports the pain comes in waves.  Patient reports she has a history of endometriosis.  Patient reports she IUD.  The history is provided by the patient. No language interpreter was used.  Pelvic Pain This is a new problem. The problem occurs constantly. The problem has not changed since onset.Associated symptoms include abdominal pain. Nothing aggravates the symptoms.       Home Medications Prior to Admission medications   Medication Sig Start Date End Date Taking? Authorizing Provider  cephALEXin  (KEFLEX ) 500 MG capsule Take 1 capsule (500 mg total) by mouth 4 (four) times daily. 06/30/23   Carter Kassel K, PA-C  DULoxetine  (CYMBALTA ) 60 MG capsule TAKE 1 CAPSULE BY MOUTH EVERY DAY 02/05/23   Millikan, Megan, NP  fluconazole  (DIFLUCAN ) 150 MG tablet Take 1 tablet (150 mg total) by mouth every 3 (three) days as needed. Patient not taking: Reported on 06/30/2023 06/18/23   Angelia Kelp, PA-C  ondansetron  (ZOFRAN -ODT) 4 MG disintegrating tablet Take 1 tablet (4 mg total) by mouth every 8 (eight) hours as needed for nausea or vomiting. 05/22/23   Farris Hong, PA-C  SEMAGLUTIDE PO Take 20 Units by mouth once a week.    [provider]  sulfamethoxazole -trimethoprim  (BACTRIM  DS) 800-160 MG tablet Take 1 tablet by mouth 2 (two) times daily. Patient not taking: Reported on 06/30/2023 06/18/23    Angelia Kelp, PA-C  tizanidine  (ZANAFLEX ) 2 MG capsule Take 1 capsule (2 mg total) by mouth at bedtime. 04/25/23   Millikan, Megan, NP  UBRELVY  100 MG TABS TAKE 1 TABLET BY MOUTH UP TO TWICE DAILY AS NEEDED FOR MIGRAINE. WAIT AT LEAST 2HRS BETWEEN DOSES Patient not taking: Reported on 06/30/2023 11/05/22   Clem Currier, NP      Allergies    Other and Reglan  [metoclopramide ]    Review of Systems   Review of Systems  Gastrointestinal:  Positive for abdominal pain.  Genitourinary:  Positive for pelvic pain.  All other systems reviewed and are negative.   Physical Exam Updated Vital Signs BP 111/81 (BP Location: Left Arm)   Pulse (!) 120   Temp 98 F (36.7 C)   Resp 20   Ht 5\' 4"  (1.626 m)   Wt 113 kg   SpO2 96%   BMI 42.76 kg/m  Physical Exam Vitals and nursing note reviewed.  Constitutional:      Appearance: She is well-developed.  HENT:     Head: Normocephalic.  Cardiovascular:     Rate and Rhythm: Normal rate.  Pulmonary:     Effort: Pulmonary effort is normal.  Abdominal:     General: There is no distension.  Musculoskeletal:        General: Normal range of motion.     Cervical back: Normal range of motion.  Skin:    General: Skin is warm.  Neurological:     General:  No focal deficit present.     Mental Status: She is alert and oriented to person, place, and time.     ED Results / Procedures / Treatments   Labs (all labs ordered are listed, but only abnormal results are displayed) Labs Reviewed  COMPREHENSIVE METABOLIC PANEL WITH GFR - Abnormal; Notable for the following components:      Result Value   Potassium 3.4 (*)    Glucose, Bld 111 (*)    Creatinine, Ser 1.06 (*)    All other components within normal limits  CBC - Abnormal; Notable for the following components:   WBC 11.9 (*)    All other components within normal limits  LIPASE, BLOOD  HCG, SERUM, QUALITATIVE  URINALYSIS, ROUTINE W REFLEX MICROSCOPIC    EKG None  Radiology No  results found.  Procedures Procedures    Medications Ordered in ED Medications  sodium chloride  0.9 % bolus 1,000 mL (has no administration in time range)  morphine (PF) 4 MG/ML injection 4 mg (has no administration in time range)    ED Course/ Medical Decision Making/ A&P                                 Medical Decision Making Patient complains of right lower quadrant abdominal pain.  Patient reports she was told that she had a ruptured ovarian cyst by her gynecologist.  Amount and/or Complexity of Data Reviewed Labs: ordered. Decision-making details documented in ED Course.    Details: UA shows large hemoglobin.  Patient reports she has had some vaginal spotting CBC shows white blood cell count 11.9.  Normal hemoglobin Chemistry showed glucose of 111.  Potassium is 3.4 Radiology: ordered.    Details: Ultrasound pelvis rule out ovarian torsion shows no acute findings no ovarian cyst normal flow.  Risk Prescription drug management. Risk Details: Patient given IV fluids.  Patient given morphine 4 mg IV.  Pelvic ultrasound is ordered to make sure that patient does not have an ovarian torsion given history of ovarian cyst and severe pain at this time.  Ultrasound showed no evidence of ovarian cyst or torsion.  I advised patient to schedule to see her gynecologist for recheck next week.  Patient is advised ibuprofen for discomfort return if any problems.           Final Clinical Impression(s) / ED Diagnoses Final diagnoses:  Pelvic pain in female    Rx / DC Orders ED Discharge Orders     None      An After Visit Summary was printed and given to the patient.    Lorri Fukuhara K, PA-C 07/28/23 2031    Rosealee Concha, MD 07/28/23 2126

## 2023-08-07 ENCOUNTER — Telehealth: Admitting: Physician Assistant

## 2023-08-07 DIAGNOSIS — G43809 Other migraine, not intractable, without status migrainosus: Secondary | ICD-10-CM | POA: Diagnosis not present

## 2023-08-07 NOTE — Patient Instructions (Signed)
 Jolly Doubleday, thank you for joining Hyla Maillard, PA-C for today's virtual visit.  While this provider is not your primary care provider (PCP), if your PCP is located in our provider database this encounter information will be shared with them immediately following your visit.   A [facility] MyChart account gives you access to today's visit and all your visits, tests, and labs performed at [facility] " click here if you don't have a [facility] MyChart account or go to [email or web address]  Consent: (Patient) Cassandra Ramos provided verbal consent for this virtual visit at the beginning of the encounter.  Current Medications:  Current Outpatient Medications:    cephALEXin  (KEFLEX ) 500 MG capsule, Take 1 capsule (500 mg total) by mouth 4 (four) times daily., Disp: 28 capsule, Rfl: 0   DULoxetine  (CYMBALTA ) 60 MG capsule, TAKE 1 CAPSULE BY MOUTH EVERY DAY, Disp: 90 capsule, Rfl: 0   fluconazole  (DIFLUCAN ) 150 MG tablet, Take 1 tablet (150 mg total) by mouth every 3 (three) days as needed. (Patient not taking: Reported on 06/30/2023), Disp: 2 tablet, Rfl: 0   ondansetron  (ZOFRAN -ODT) 4 MG disintegrating tablet, Take 1 tablet (4 mg total) by mouth every 8 (eight) hours as needed for nausea or vomiting., Disp: 20 tablet, Rfl: 0   SEMAGLUTIDE PO, Take 20 Units by mouth once a week., Disp: , Rfl:    sulfamethoxazole -trimethoprim  (BACTRIM  DS) 800-160 MG tablet, Take 1 tablet by mouth 2 (two) times daily. (Patient not taking: Reported on 06/30/2023), Disp: 10 tablet, Rfl: 0   tizanidine  (ZANAFLEX ) 2 MG capsule, Take 1 capsule (2 mg total) by mouth at bedtime., Disp: 30 capsule, Rfl: 5   UBRELVY  100 MG TABS, TAKE 1 TABLET BY MOUTH UP TO TWICE DAILY AS NEEDED FOR MIGRAINE. WAIT AT LEAST 2HRS BETWEEN DOSES (Patient not taking: Reported on 06/30/2023), Disp: 10 tablet, Rfl: 2   Medications ordered in this encounter:  No orders of the defined types were placed in this encounter.     *If you need refills on other medications prior to your next appointment, please contact your pharmacy*  Follow-Up: Call back or seek an in-person evaluation if the symptoms worsen or if the condition fails to improve as anticipated.  [facility] [phone]  Other Instructions Migraine Headache A migraine headache is a very strong throbbing pain on one or both sides of your head. This type of headache can also cause other symptoms. It can last from 4 hours to 3 days. Talk with your doctor about what things may bring on (trigger) this condition. What are the causes? The exact cause of a migraine is not known. This condition may be brought on or caused by: Smoking. Medicines, such as: Medicine used to treat chest pain (nitroglycerin). Birth control pills. Estrogen. Some blood pressure medicines. Certain substances in some foods or drinks. Foods and drinks, such as: Cheese. Chocolate. Alcohol. Caffeine. Doing physical activity that is very hard. Other things that may trigger a migraine headache include: Periods. Pregnancy. Hunger. Stress. Getting too much or too little sleep. Weather changes. Feeling tired (fatigue). What increases the risk? Being 85-26 years old. Being female. Having a family history of migraine headaches. Being Caucasian. Having a mental health condition, such as being sad (depressed) or feeling worried or nervous (anxious). Being very overweight (obese). What are the signs or symptoms? A throbbing pain. This pain may: Happen in any area of the head, such as on one or both sides. Make it hard to do  daily activities. Get worse with physical activity. Get worse around bright lights, loud noises, or smells. Other symptoms may include: Feeling like you may vomit (nauseous). Vomiting. Dizziness. Before a migraine headache starts, you may get warning signs (an aura). An aura may include: Seeing flashing lights or having blind  spots. Seeing bright spots, halos, or zigzag lines. Having tunnel vision or blurred vision. Having numbness or a tingling feeling. Having trouble talking. Having weak muscles. After a migraine ends, you may have symptoms. These may include: Tiredness. Trouble thinking (concentrating). How is this treated? Taking medicines that: Relieve pain. Relieve the feeling like you may vomit. Prevent migraine headaches. Treatment may also include: Acupuncture. Lifestyle changes like avoiding foods that bring on migraine headaches. Learning ways to control your body functions (biofeedback). Therapy to help you know and deal with negative thoughts (cognitive behavioral therapy). Follow these instructions at home: Medicines Take over-the-counter and prescription medicines only as told by your doctor. If told, take steps to prevent problems with pooping (constipation). You may need to: Drink enough fluid to keep your pee (urine) pale yellow. Take medicines. You will be told what medicines to take. Eat foods that are high in fiber. These include beans, whole grains, and fresh fruits and vegetables. Limit foods that are high in fat and sugar. These include fried or sweet foods. Ask your doctor if you should avoid driving or using machines while you are taking your medicine. Lifestyle  Do not drink alcohol. Do not smoke or use any products that contain nicotine or tobacco. If you need help quitting, ask your doctor. Get 7-9 hours of sleep each night, or the amount recommended by your doctor. Find ways to deal with stress, such as meditation, deep breathing, or yoga. Try to exercise often. This can help lessen how bad and how often your migraines happen. General instructions Keep a journal to find out what may bring on your migraine headaches. This can help you avoid those things. For example, write down: What you eat and drink. How much sleep you get. Any change to your medicines or diet. If you  have a migraine headache: Avoid things that make your symptoms worse, such as bright lights. Lie down in a dark, quiet room. Do not drive or use machinery. Ask your doctor what activities are safe for you. Where to find more information Coalition for Headache and Migraine Patients (CHAMP): headachemigraine.org American Migraine Foundation: americanmigrainefoundation.org National Headache Foundation: headaches.org Contact a doctor if: You get a migraine headache that is different or worse than others you have had. You have more than 15 days of headaches in one month. Get help right away if: Your migraine headache gets very bad. Your migraine headache lasts more than 72 hours. You have a fever or stiff neck. You have trouble seeing. Your muscles feel weak or like you cannot control them. You lose your balance a lot. You have trouble walking. You faint. You have a seizure. This information is not intended to replace advice given to you by your health care provider. Make sure you discuss any questions you have with your health care provider. Document Revised: 10/24/2021 Document Reviewed: 10/24/2021 Elsevier Patient Education  2024 Elsevier Inc.   If you have been instructed to have an in-person evaluation today at a local Urgent Care facility, please use the link below. It will take you to a list of all of our available [facility], including address, phone number and hours of operation. Please do not delay  care.  [facility]  If you or a family member do not have a primary care provider, use the link below to schedule a visit and establish care. When you choose a [facility] primary care physician or advanced practice provider, you gain a long-term partner in health. Find a Primary Care Provider  Learn more about [facility]'s in-office and virtual care options: [facility] - Get Care Now

## 2023-08-07 NOTE — Progress Notes (Signed)
 Virtual Visit Consent   Cassandra Ramos, you are scheduled for a virtual visit with a [facility] provider today. Just as with appointments in the office, your consent must be obtained to participate. Your consent will be active for this visit and any virtual visit you may have with one of our providers in the next 365 days. If you have a MyChart account, a copy of this consent can be sent to you electronically.  As this is a virtual visit, video technology does not allow for your provider to perform a traditional examination. This may limit your provider's ability to fully assess your condition. If your provider identifies any concerns that need to be evaluated in person or the need to arrange testing (such as labs, EKG, etc.), we will make arrangements to do so. Although advances in technology are sophisticated, we cannot ensure that it will always work on either your end or our end. If the connection with a video visit is poor, the visit may have to be switched to a telephone visit. With either a video or telephone visit, we are not always able to ensure that we have a secure connection.  By engaging in this virtual visit, you consent to the provision of healthcare and authorize for your insurance to be billed (if applicable) for the services provided during this visit. Depending on your insurance coverage, you may receive a charge related to this service.  I need to obtain your verbal consent now. Are you willing to proceed with your visit today? Cassandra Ramos has provided verbal consent on 08/07/2023 for a virtual visit (video or telephone). Cassandra Ramos, [place]  Date: 08/07/2023 6:17 PM   Virtual Visit via Video Note   I, Cassandra Ramos, connected with  Cassandra Ramos  ([id number], 2000/07/14) on 08/07/23 at  6:15 PM EDT by a video-enabled telemedicine application and verified that I am speaking with the correct person using two identifiers.  Location: Patient: Virtual Visit Location  Patient: Home Provider: Virtual Visit Location Provider: Home Office   I discussed the limitations of evaluation and management by telemedicine and the availability of in person appointments. The patient expressed understanding and agreed to proceed.    History of Present Illness: Cassandra Ramos is a 23 y.o. who identifies as a female who was assigned female at birth, and is being seen today for migraine headache. Notes recently started on [place] for endometriosis -- question if triggering migraine today -- could also be the weather. Felt quite under the weather due to migraine so unable to get into work today. Notes this is her common migraine with headache and photosensitivity. Denies any new or atypical symptoms. Has Ubrelvy  and Aleve for her migraines which she has taken with some improvement. Headache is mild at present.   HPI: HPI  Problems:  Patient Active Problem List   Diagnosis Date Noted   Iron  deficiency anemia 03/01/2020   Other chronic pain 07/16/2019   Chronic migraine without aura without status migrainosus, not intractable 09/11/2018    Allergies:  Allergies  Allergen Reactions   Other     Allergic to an ingredient in certain Deoderants and something that is in Cetaphil face wash.    Reglan  [Metoclopramide ] Other (See Comments)    "It made me want to die"   Medications:  Current Outpatient Medications:    cephALEXin  (KEFLEX ) 500 MG capsule, Take 1 capsule (500 mg total) by mouth 4 (four) times daily., Disp: 28 capsule, Rfl: 0   DULoxetine  (CYMBALTA )  60 MG capsule, TAKE 1 CAPSULE BY MOUTH EVERY DAY, Disp: 90 capsule, Rfl: 0   fluconazole  (DIFLUCAN ) 150 MG tablet, Take 1 tablet (150 mg total) by mouth every 3 (three) days as needed. (Patient not taking: Reported on 06/30/2023), Disp: 2 tablet, Rfl: 0   ondansetron  (ZOFRAN -ODT) 4 MG disintegrating tablet, Take 1 tablet (4 mg total) by mouth every 8 (eight) hours as needed for nausea or vomiting., Disp: 20 tablet, Rfl: 0    SEMAGLUTIDE PO, Take 20 Units by mouth once a week., Disp: , Rfl:    sulfamethoxazole -trimethoprim  (BACTRIM  DS) 800-160 MG tablet, Take 1 tablet by mouth 2 (two) times daily. (Patient not taking: Reported on 06/30/2023), Disp: 10 tablet, Rfl: 0   tizanidine  (ZANAFLEX ) 2 MG capsule, Take 1 capsule (2 mg total) by mouth at bedtime., Disp: 30 capsule, Rfl: 5   UBRELVY  100 MG TABS, TAKE 1 TABLET BY MOUTH UP TO TWICE DAILY AS NEEDED FOR MIGRAINE. WAIT AT LEAST 2HRS BETWEEN DOSES (Patient not taking: Reported on 06/30/2023), Disp: 10 tablet, Rfl: 2  Observations/Objective: Patient is well-developed, well-nourished in no acute distress.  Resting comfortably at home.  Head is normocephalic, atraumatic.  No labored breathing. Speech is clear and coherent with logical content.  Patient is alert and oriented at baseline.   Assessment and Plan: There are no diagnoses linked to this encounter. Improving with Ubrelvy  and Aleve. Encouraged small amount of caffeine intake, good hydration and rest. Will provide note for work for the day. Follow-up with PCP for any non-resolving symptoms.   Follow Up Instructions: I discussed the assessment and treatment plan with the patient. The patient was provided an opportunity to ask questions and all were answered. The patient agreed with the plan and demonstrated an understanding of the instructions.  A copy of instructions were sent to the patient via MyChart unless otherwise noted below.   The patient was advised to call back or seek an in-person evaluation if the symptoms worsen or if the condition fails to improve as anticipated.    Cassandra Maillard, PA-C

## 2023-08-07 NOTE — Progress Notes (Signed)
   Thank you for the details you included in the comment boxes. Those details are very helpful in determining the best course of treatment for you and help us  to provide the best care.Because we cannot evaluate or treat migraines through e-visit, we recommend that you schedule a Virtual Urgent Care video visit in order for the provider to better assess what is going on.  The provider will be able to give you a more accurate diagnosis and treatment plan if we can more freely discuss your symptoms and with the addition of a virtual examination.   If you change your visit to a video visit, we will bill your insurance (similar to an office visit) and you will not be charged for this e-Visit. You will be able to stay at home and speak with the first available [facility] advanced practice provider. The link to do a video visit is in the drop down Menu tab of your Welcome screen in MyChart.

## 2023-08-16 ENCOUNTER — Telehealth: Admitting: Family Medicine

## 2023-08-16 DIAGNOSIS — M25569 Pain in unspecified knee: Secondary | ICD-10-CM

## 2023-08-16 NOTE — Progress Notes (Signed)
 [place]   Sprained her knee while at work. Needs a note to be out, but also needs assessment given it was a work injury. In person is recommended and advised.  Patient acknowledged agreement and understanding of the plan.

## 2023-08-16 NOTE — Progress Notes (Signed)
 I spoke with you earlier and you provided different answers to your injury. We can not and will not treat your condition as it occurred at your place of employment. Additional, we see this as misuse of our services as we strive to provide safe care regarding truthful answers.   Please be upfront and honest with your responses so that we can provide safe and appropriate care should we see if safe to treat.    We still will recommend you be seen in person, as not only did your injury occur at work, your answers have changed as well.   NOTE: There will be NO CHARGE for this E-Visit   If you are having a true medical emergency, please call 911.

## 2023-08-21 ENCOUNTER — Encounter: Payer: Self-pay | Admitting: Hematology

## 2023-09-03 ENCOUNTER — Telehealth: Admitting: Physician Assistant

## 2023-09-03 DIAGNOSIS — J4521 Mild intermittent asthma with (acute) exacerbation: Secondary | ICD-10-CM | POA: Diagnosis not present

## 2023-09-03 DIAGNOSIS — R12 Heartburn: Secondary | ICD-10-CM | POA: Diagnosis not present

## 2023-09-03 MED ORDER — ALBUTEROL SULFATE HFA 108 (90 BASE) MCG/ACT IN AERS: 1.0000 | INHALATION_SPRAY | Freq: Four times a day (QID) | RESPIRATORY_TRACT | 0 refills | Status: AC | PRN

## 2023-09-03 MED ORDER — FAMOTIDINE 20 MG PO TABS: 20.0000 mg | ORAL_TABLET | Freq: Two times a day (BID) | ORAL | 0 refills | Status: DC | PRN

## 2023-09-03 NOTE — Progress Notes (Signed)
 E-Visit for Asthma  Based on what you have shared with me, it looks like you may have a flare up of your asthma.  Asthma is a chronic (ongoing) lung disease which results in airway obstruction, inflammation and hyper-responsiveness.   Asthma symptoms vary from person to person, with common symptoms including nighttime awakening and decreased ability to participate in normal activities as a result of shortness of breath. It is often triggered by changes in weather, changes in the season, changes in air temperature, or inside (home, school, daycare or work) allergens such as animal dander, mold, mildew, woodstoves or cockroaches.   It can also be triggered by hormonal changes, extreme emotion, physical exertion or an upper respiratory tract illness.     It is important to identify the trigger, and then eliminate or avoid the trigger if possible.   If you have been prescribed medications to be taken on a regular basis, it is important to follow the asthma action plan and to follow guidelines to adjust medication in response to increasing symptoms of decreased peak expiratory flow rate  Treatment: I have prescribed: Albuterol  (Proventil  HFA; Ventolin  HFA) 108 (90 Base) MCG/ACT Inhaler 2 puffs into the lungs every six hours as needed for wheezing or shortness of breath  I have also prescribed Famotidine for acid reflux. Take 1 tablet twice daily as needed.   A work note has been provided for you today. It will be available under letters in your MyChart account.   HOME CARE Only take medications as instructed by your medical team. Consider wearing a mask or scarf to improve breathing air temperature have been shown to decrease irritation and decrease exacerbations Get rest. Taking a steamy shower or using a humidifier may help nasal congestion sand ease sore throat pain. You can place a  towel over your head and breathe in the steam from hot water coming from a faucet. Using a saline nasal spray works much the same way.  Cough drops, hare candies and sore throat lozenges may ease your cough.  Avoid close contacts especially the very you and the elderly Cover your mouth if you cough or sneeze Always remember to wash your hands.    GET HELP RIGHT AWAY IF: You develop worsening symptoms; breathlessness at rest, drowsy, confused or agitated, unable to speak in full sentences You have coughing fits You develop a severe headache or visual changes You develop shortness of breath, difficulty breathing or start having chest pain Your symptoms persist after you have completed your treatment plan If your symptoms do not improve within 10 days  MAKE SURE YOU Understand these instructions. Will watch your condition. Will get help right away if you are not doing well or get worse.   Your e-visit answers were reviewed by a board certified advanced clinical practitioner to complete your personal care plan, Depending upon the condition, your plan could have included both over the counter or prescription medications.   Please review your pharmacy choice. Your safety is important to us . If you have drug allergies check your prescription carefully.  You can use MyChart to ask questions about today's visit, request a non-urgent  call back, or ask for a work or school excuse for 24 hours related to this e-Visit. If it has been greater than 24 hours you will need to follow up with your provider, or enter a new e-Visit to address those concerns.   You will get an e-mail in the next two days asking about your experience. I  hope that your e-visit has been valuable and will speed your recovery. Thank you for using e-visits.    I have spent 5 minutes in review of e-visit questionnaire, review and updating patient chart, medical decision making and response to patient.   Delon CHRISTELLA Dickinson,  PA-C

## 2023-09-13 ENCOUNTER — Ambulatory Visit: Payer: BC Managed Care – PPO | Admitting: Adult Health

## 2023-09-13 ENCOUNTER — Telehealth: Admitting: Physician Assistant

## 2023-09-13 DIAGNOSIS — J4521 Mild intermittent asthma with (acute) exacerbation: Secondary | ICD-10-CM

## 2023-09-13 NOTE — Progress Notes (Signed)
 E-Visit for Asthma  Based on what you have shared with me, it looks like you may have a flare up of your asthma.  Asthma is a chronic (ongoing) lung disease which results in airway obstruction, inflammation and hyper-responsiveness.   Asthma symptoms vary from person to person, with common symptoms including nighttime awakening and decreased ability to participate in normal activities as a result of shortness of breath. It is often triggered by changes in weather, changes in the season, changes in air temperature, or inside (home, school, daycare or work) allergens such as animal dander, mold, mildew, woodstoves or cockroaches.   It can also be triggered by hormonal changes, extreme emotion, physical exertion or an upper respiratory tract illness.     It is important to identify the trigger, and then eliminate or avoid the trigger if possible.   If you have been prescribed medications to be taken on a regular basis, it is important to follow the asthma action plan and to follow guidelines to adjust medication in response to increasing symptoms of decreased peak expiratory flow rate  Treatment: A work note has been provided for you today. It will be available under letters in your MyChart account.   HOME CARE Only take medications as instructed by your medical team. Consider wearing a mask or scarf to improve breathing air temperature have been shown to decrease irritation and decrease exacerbations Get rest. Taking a steamy shower or using a humidifier may help nasal congestion sand ease sore throat pain. You can place a towel over your head and breathe in the steam from hot water coming from a faucet. Using a saline nasal spray works much the same way.  Cough drops, hare candies and sore throat lozenges may ease your cough.  Avoid close contacts especially the very you and the  elderly Cover your mouth if you cough or sneeze Always remember to wash your hands.    GET HELP RIGHT AWAY IF: You develop worsening symptoms; breathlessness at rest, drowsy, confused or agitated, unable to speak in full sentences You have coughing fits You develop a severe headache or visual changes You develop shortness of breath, difficulty breathing or start having chest pain Your symptoms persist after you have completed your treatment plan If your symptoms do not improve within 10 days  MAKE SURE YOU Understand these instructions. Will watch your condition. Will get help right away if you are not doing well or get worse.   Your e-visit answers were reviewed by a board certified advanced clinical practitioner to complete your personal care plan, Depending upon the condition, your plan could have included both over the counter or prescription medications.   Please review your pharmacy choice. Your safety is important to us . If you have drug allergies check your prescription carefully.  You can use MyChart to ask questions about today's visit, request a non-urgent  call back, or ask for a work or school excuse for 24 hours related to this e-Visit. If it has been greater than 24 hours you will need to follow up with your provider, or enter a new e-Visit to address those concerns.   You will get an e-mail in the next two days asking about your experience. I hope that your e-visit has been valuable and will speed your recovery. Thank you for using e-visits.    I have spent 5 minutes in review of e-visit questionnaire, review and updating patient chart, medical decision making and response to patient.   Delon CHRISTELLA Dickinson,  PA-C

## 2023-09-17 ENCOUNTER — Telehealth: Admitting: Physician Assistant

## 2023-09-17 DIAGNOSIS — G43809 Other migraine, not intractable, without status migrainosus: Secondary | ICD-10-CM

## 2023-09-17 NOTE — Progress Notes (Signed)
  Because you are having a migraine with nausea and vomiting and recently had an asthma exacerbation all requiring work notes, I feel your condition warrants further evaluation and I recommend that you be seen in a face-to-face visit. You may can contact your prescriber of the [facility] for management since the medication brings on your migraines, but if unavailable it is recommended to be seen in person.    NOTE: There will be NO CHARGE for this E-Visit   If you are having a true medical emergency, please call 911.     For an urgent face to face visit, [place] has multiple urgent care centers for your convenience.  Click the link below for the full list of locations and hours, walk-in wait times, appointment scheduling options and driving directions:  Urgent Care - [place], [place], [place], [place], [place], [place]  [place]     Your MyChart E-visit questionnaire answers were reviewed by a board certified advanced clinical practitioner to complete your personal care plan based on your specific symptoms.    Thank you for using e-Visits.     I have spent 5 minutes in review of e-visit questionnaire, review and updating patient chart, medical decision making and response to patient.   Delon CHRISTELLA Dickinson, PA-C

## 2023-09-19 ENCOUNTER — Other Ambulatory Visit: Payer: Self-pay

## 2023-09-19 ENCOUNTER — Emergency Department (HOSPITAL_BASED_OUTPATIENT_CLINIC_OR_DEPARTMENT_OTHER): Admitting: Radiology

## 2023-09-19 ENCOUNTER — Emergency Department (HOSPITAL_BASED_OUTPATIENT_CLINIC_OR_DEPARTMENT_OTHER)
Admission: EM | Admit: 2023-09-19 | Discharge: 2023-09-19 | Disposition: A | Discharge status: Home / Self Care | Attending: Emergency Medicine | Admitting: Emergency Medicine

## 2023-09-19 ENCOUNTER — Encounter: Payer: Self-pay | Admitting: Hematology

## 2023-09-19 DIAGNOSIS — G90A Postural orthostatic tachycardia syndrome (POTS): Secondary | ICD-10-CM | POA: Insufficient documentation

## 2023-09-19 DIAGNOSIS — R000 Tachycardia, unspecified: Secondary | ICD-10-CM | POA: Diagnosis present

## 2023-09-19 LAB — COMPREHENSIVE METABOLIC PANEL WITH GFR
ALT: 13 U/L (ref 0–44)
AST: 22 U/L (ref 15–41)
Albumin: 4.1 g/dL (ref 3.5–5.0)
Alkaline Phosphatase: 90 U/L (ref 38–126)
Anion gap: 11 (ref 5–15)
BUN: 10 mg/dL (ref 6–20)
CO2: 24 mmol/L (ref 22–32)
Calcium: 9.8 mg/dL (ref 8.9–10.3)
Chloride: 101 mmol/L (ref 98–111)
Creatinine, Ser: 1.03 mg/dL — ABNORMAL HIGH (ref 0.44–1.00)
GFR, Estimated: >60 mL/min (ref >60)
Glucose, Bld: 98 mg/dL (ref 70–99)
Potassium: 4.1 mmol/L (ref 3.5–5.1)
Sodium: 137 mmol/L (ref 135–145)
Total Bilirubin: 0.7 mg/dL (ref 0.0–1.2)
Total Protein: 7.5 g/dL (ref 6.5–8.1)

## 2023-09-19 LAB — CBC WITH DIFFERENTIAL/PLATELET
Abs Immature Granulocytes: 0.05 K/uL (ref 0.00–0.07)
Basophils Absolute: 0 K/uL (ref 0.0–0.1)
Basophils Relative: 0 %
Eosinophils Absolute: 0 K/uL (ref 0.0–0.5)
Eosinophils Relative: 0 %
HCT: 37.3 % (ref 36.0–46.0)
Hemoglobin: 11.9 g/dL — ABNORMAL LOW (ref 12.0–15.0)
Immature Granulocytes: 1 %
Lymphocytes Relative: 24 %
Lymphs Abs: 2.3 K/uL (ref 0.7–4.0)
MCH: 25.8 pg — ABNORMAL LOW (ref 26.0–34.0)
MCHC: 31.9 g/dL (ref 30.0–36.0)
MCV: 80.9 fL (ref 80.0–100.0)
Monocytes Absolute: 0.4 K/uL (ref 0.1–1.0)
Monocytes Relative: 5 %
Neutro Abs: 6.5 K/uL (ref 1.7–7.7)
Neutrophils Relative %: 70 %
Platelets: 320 K/uL (ref 150–400)
RBC: 4.61 MIL/uL (ref 3.87–5.11)
RDW: 13.2 % (ref 11.5–15.5)
WBC: 9.3 K/uL (ref 4.0–10.5)
nRBC: 0 % (ref 0.0–0.2)

## 2023-09-19 LAB — PRO BRAIN NATRIURETIC PEPTIDE: Pro Brain Natriuretic Peptide: <36 pg/mL (ref <300.0)

## 2023-09-19 LAB — TSH: TSH: 0.803 u[IU]/mL (ref 0.350–4.500)

## 2023-09-19 LAB — TROPONIN T, HIGH SENSITIVITY: Troponin T High Sensitivity: <15 ng/L (ref <19)

## 2023-09-19 LAB — D-DIMER, QUANTITATIVE: D-Dimer, Quant: <0.27 ug{FEU}/mL (ref 0.00–0.50)

## 2023-09-19 LAB — MAGNESIUM: Magnesium: 2.2 mg/dL (ref 1.7–2.4)

## 2023-09-19 LAB — HCG, SERUM, QUALITATIVE: Preg, Serum: NEGATIVE

## 2023-09-19 MED ORDER — LACTATED RINGERS IV BOLUS
1000.0000 mL | Freq: Once | INTRAVENOUS | Status: AC
  Administered 2023-09-19: 1000 mL via INTRAVENOUS

## 2023-09-19 MED ORDER — HYDROXYZINE HCL 25 MG PO TABS: 25.0000 mg | ORAL_TABLET | Freq: Four times a day (QID) | ORAL | 0 refills | Status: AC | PRN

## 2023-09-19 MED ORDER — HYDROXYZINE HCL 25 MG PO TABS: 25.0000 mg | ORAL_TABLET | Freq: Four times a day (QID) | ORAL | 0 refills | Status: DC

## 2023-09-19 MED ORDER — HYDROXYZINE HCL 25 MG PO TABS
25.0000 mg | ORAL_TABLET | Freq: Once | ORAL | Status: AC
  Administered 2023-09-19: 25 mg via ORAL
  Filled 2023-09-19: qty 1

## 2023-09-19 NOTE — ED Notes (Signed)
 Patient request BP be taken on wrist because it hurts.

## 2023-09-19 NOTE — ED Triage Notes (Signed)
 Pt caox4, ambulatory reporting hx of dysautonomia with fluctuations in BP and HR. Pt states she had HR of 180 yesterday with BP 80/60 last night. Pt c/o fatigue and weakness x1 wk.

## 2023-09-19 NOTE — Discharge Instructions (Addendum)
 It was a pleasure taking care of you today.   We discussed ways to prevent the hypotension such as eating and drinking enough, as well as avoiding triggers (heat, certain foods). I sent in Atarax , please take one tablet as needed. Please follow up with your PCP.   Return to the ED if your condition suddenly worsens, if you have swelling in your leg, or if you fall and hit your head.

## 2023-09-19 NOTE — ED Provider Notes (Signed)
 [place] EMERGENCY DEPARTMENT AT [facility] Provider Note   CSN: [id number] Arrival date & time: 09/19/23  9183     Patient presents with: Hypotension   Cassandra Ramos is a 23 y.o. female.   Patient presents with hypotension. 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 She hasn't had a period for three months, when she started medicine to cause her endometriosis to shrink. On presentation her blood pressure is 135/79 but that it was 90s/50s last night and her heart rate was elevated. She has seen two cardiologists about this and has tried several medications (salt pill and beta blocker) but nothing helped. She denies dizziness or any acute falls. She denies changes in vision, trouble swallowing, changes in bowel movements or urination, or difficulty ambulating. She states she has muscle aches all over and believes this is due to dehydration. Last night she had chest pain when her heart rate was elevated, she says she feels this pain when her heart rate is elevated for more than four hours. She states the chest pain radiates down her left shoulder. She also has tingling in her fingers but it comes and go. She does not sleep well because of being anxious about her heart. She would like for us  to provide fluids for her and help her calm down so she can sleep.  She said her 67 year old sister had a clot, her sister also has migraines.  Patient is an EMT.        Prior to Admission medications   Medication Sig Start Date End Date Taking? Authorizing Provider  albuterol  (VENTOLIN  HFA) 108 (90 Base) MCG/ACT inhaler Inhale 1-2 puffs into the lungs every 6 (six) hours as needed. 09/03/23   Vivienne Delon HERO, PA-C  DULoxetine  (CYMBALTA ) 60 MG capsule TAKE 1 CAPSULE BY MOUTH EVERY DAY 02/05/23   Millikan, Megan, NP  famotidine  (PEPCID ) 20 MG tablet Take 1 tablet (20 mg total) by mouth 2 (two) times daily as  needed for heartburn or indigestion. 09/03/23   Vivienne Delon HERO, PA-C  ondansetron  (ZOFRAN -ODT) 4 MG disintegrating tablet Take 1 tablet (4 mg total) by mouth every 8 (eight) hours as needed for nausea or vomiting. 05/22/23   Gladis Elsie BROCKS, PA-C  SEMAGLUTIDE PO Take 20 Units by mouth once a week.    [provider]    Allergies: Other and Reglan  [metoclopramide ]    Review of Systems  Updated Vital Signs BP 135/79   Pulse 93   Ht 5' 4 (1.626 m)   Wt 104.3 kg   SpO2 99%   BMI 39.48 kg/m   Physical Exam Constitutional:      General: She is not in acute distress.    Appearance: Normal appearance. She is not ill-appearing or diaphoretic.  HENT:     Head: Normocephalic and atraumatic.     Nose: No congestion or rhinorrhea.  Eyes:     Extraocular Movements: Extraocular movements intact.  Cardiovascular:     Heart sounds: Normal heart sounds.  Pulmonary:     Effort: Pulmonary effort is normal.  Abdominal:     General: Bowel sounds are normal. There is no distension.     Palpations: Abdomen is soft.     Tenderness: There is no abdominal tenderness. There is no guarding or rebound.  Musculoskeletal:        General: No swelling or deformity. Normal range of motion.  Neurological:     General: No  focal deficit present.     Mental Status: She is alert and oriented to person, place, and time.  Psychiatric:        Mood and Affect: Mood normal.        Behavior: Behavior normal.     (all labs ordered are listed, but only abnormal results are displayed) Labs Reviewed  HCG, SERUM, QUALITATIVE  CBC WITH DIFFERENTIAL/PLATELET  COMPREHENSIVE METABOLIC PANEL WITH GFR  TSH  MAGNESIUM  D-DIMER, QUANTITATIVE  TROPONIN T, HIGH SENSITIVITY    EKG: None  Radiology: No results found.   Procedures   Medications Ordered in the ED  lactated ringers  bolus 1,000 mL (has no administration in time range)  hydrOXYzine  (ATARAX ) tablet 25 mg (has no administration in time  range)                                    Medical Decision Making Amount and/or Complexity of Data Reviewed Labs: ordered. Radiology: ordered.  Risk Prescription drug management.   This patient presents to the ED for concern of hypotension, this involves an extensive number of treatment options, and is a complaint that carries with it a high risk of complications and morbidity.  The differential diagnosis includes postural orthostatic tachycardia syndrome, dehydration, baroreflex dysfunction, dysautonomia, medication adverse effect, autoimmune disorder, lupus   Co morbidities / Chronic conditions that complicate the patient evaluation  Fibromyalgia, POTS, possible mast cell activation, migraine  Lab Tests:  I Ordered, and personally interpreted labs.  The pertinent results include:   CMP: Cr 1.03, otherwise wnl CBC: hbg 11.9 TSH: wnl Nch: negative BNP: wnl Troponin: <15 D-dimer: <.27  Mg: wnl   Imaging Studies ordered:  I ordered imaging studies including chest x ray  I independently visualized and interpreted imaging which showed no active cardiopulmonary disease I agree with the radiologist interpretation   Cardiac Monitoring: / EKG:  The patient was maintained on a cardiac monitor.  I personally viewed and interpreted the cardiac monitored which showed an underlying rhythm of: normal sinus   Problem List / ED Course / Critical interventions / Medication management  POTS I ordered medication including lactated ringers  and hydroxyzine . The patient stated she would like fluids to settle her heart, calm her anxiety and help her rest. Reevaluation of the patient after these medicines showed that the patient feel more relaxed and comfortable  EKG and troponin do not indicate heart attack. D dimer wnl making clot less likely.  The patient was reassured that her labs were not worrisome for a clot, or heart attack, or infection. She and her significant other were  counseled to return to the ED if she ever fell and hit her head when having a episode of hypotension. Also counseled on signs and symptoms of a clot such as leg swelling and shortness of breath, as well as risk factors such as estrogen containing OCPs, being sedentary for long periods of time, personal and family history. She was recommended to avoid triggers such as foods and heat, as patient states these can cause her to have an episode. She was encouraged to follow up with her PCP for further evaluation and management.  I have reviewed the patients home medicines and have made adjustments as needed The patients vitals in the ED are stable, the patient is comfortable and agreeable for discharge.       Final diagnoses:  None    ED Discharge Orders  None          Charmayne Holmes, DO 09/19/23 1136    Jerrol Agent, MD 09/19/23 1348

## 2023-09-20 ENCOUNTER — Encounter: Payer: Self-pay | Admitting: Cardiology

## 2023-09-20 ENCOUNTER — Ambulatory Visit: Attending: Cardiology | Admitting: Cardiology

## 2023-09-20 VITALS — BP 100/80 | HR 103 | Ht 64.0 in | Wt 225.5 lb

## 2023-09-20 DIAGNOSIS — G90A Postural orthostatic tachycardia syndrome (POTS): Secondary | ICD-10-CM

## 2023-09-20 DIAGNOSIS — R079 Chest pain, unspecified: Secondary | ICD-10-CM

## 2023-09-20 DIAGNOSIS — R0602 Shortness of breath: Secondary | ICD-10-CM | POA: Diagnosis not present

## 2023-09-20 DIAGNOSIS — G901 Familial dysautonomia [Riley-Day]: Secondary | ICD-10-CM | POA: Diagnosis not present

## 2023-09-20 DIAGNOSIS — R000 Tachycardia, unspecified: Secondary | ICD-10-CM | POA: Diagnosis not present

## 2023-09-20 NOTE — Progress Notes (Signed)
 Cardiology Office Note   Date:  09/20/2023  ID:  Cassandra Ramos, DOB 30-May-2000, MRN [id number] PCP: Stephane Leita DEL, MD  [facility] Providers Cardiologist:  None     History of Present Illness Cassandra Ramos is a 23 y.o. female with a past medical history of migraine headaches, dysautonomia, mixed hyperlipidemia, palpitations, fibromyalgia, endometriosis, who is here today for follow-up after recent emergency department visit.  Patient was originally referred to cardiology at the age of 69 at the request of her pediatrician for cardiovascular risk assessment and evaluation management of dyslipidemia.  She had previously been followed by 2 pediatric cardiology and then released.  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  She was referred to lipid clinic for management of her dyslipidemia.  She had a family history of heart disease in maternal grandmother who had a heart attack in her 20s.  Her mother was noted to have high cholesterol but there were no other family members with new early onset heart disease.  She was seen in clinic in March 2021 at that time even with her lipid panel results.  Discussed this they decided not to pursue changes since she had started gaining weight and had not made any dietary changes.  She had been dealing with a large number of psychological stresses and was asked to follow-up with her psychiatrist.  She did return to clinic in 10/2020 where she had not had a repeat labs completed yet.  Stated she had switched psychiatrists and stopped all of her medications.  Her new psychiatrist recommended she see a cardiologist for history of dysautonomia.  It is not clear whether she had a history of POTS syndrome however she had a history of tachycardia in the past related to stimulants.  She was on Adderall and then switched to a different medication for ADD which caused worsening  tachycardia.  She has been evaluated in the emergency department for several varying complaints.  Her last visit she was seen 09/19/2023.  On presentation her blood pressure was 135/79 but it dropped to 90s over 50s last night heart rate was elevated.  Stated that she had seen cardiology in the past and tried several medications all pills and beta-blocker but nothing it helped.  She denied any dizziness or acute falls.  Denied any change in the vision, trouble swallowing or changes in bowel movements urination or difficulty ambulating.  She states that she has muscle aches all in place that is due to dehydration.  Last night she stated that she had had chest pain with her elevated heart rates.  States that she felt the pain more when her heart rate was elevated in more than 4 hours.  She states the chest pain radiated down her left shoulder.  She also had tingling in her fingers but it would come and go.  States she does not sleep well being anxious about her heart.  Creatinine was slightly elevated at 0.13 otherwise normal, hemoglobin 11.9, TSH was normal, D-dimer negative troponin negative magnesium normal BMP normal she was given 1 L of lactated Ringer 's and hydroxyzine .  Patient was deemed stable and was discharged home with a prescription for hydroxyzine .  She returns to clinic today worsening POTS/dysautonomia symptoms.  She is not tachycardic, hypotensive and hypotensive, with fatigue, shortness of breath, palpitations,, syncope/near syncope, atypical chest pain.  She was recently evaluated in the emergency department unfortunately did not receive  the total liter of IV hydration which would have likely been beneficial.  She stated that she was intolerant previously to beta-blocker and salt therapy.  She is continue to maintain regular hydration, salt loading, and compression therapy.  She stated previously she had been considered in remission from her POTS for several years but had undergone treatments for  endometriosis which has sparked reoccurrence of her symptoms.  ROS: 10 point review of system has been reviewed and considered negative except ones are listed in the HPI  Studies Reviewed EKG Interpretation Date/Time:  Thursday September 20 2023 13:44:17 EDT Ventricular Rate:  103 PR Interval:  134 QRS Duration:  74 QT Interval:  312 QTC Calculation: 408 R Axis:   58  Text Interpretation: Sinus tachycardia When compared with ECG of 19-Sep-2023 09:46, PREVIOUS ECG IS PRESENT Confirmed by Gerard Frederick ([id number]) on 09/20/2023 1:51:42 PM    Risk Assessment/Calculations           Physical Exam VS:  BP 100/80 (BP Location: Left Arm, Patient Position: Sitting, Cuff Size: Large)   Pulse (!) 103   Ht 5' 4 (1.626 m)   Wt 225 lb 8 oz (102.3 kg)   SpO2 98%   BMI 38.71 kg/m   Orthostatic VS for the past 24 hrs (Last 3 readings):  BP- Lying Pulse- Lying BP- Sitting Pulse- Sitting BP- Standing at 0 minutes Pulse- Standing at 0 minutes BP- Standing at 3 minutes Pulse- Standing at 3 minutes  09/20/23 1347 100/80 107 100/80 113 100/70 139 90/70 124      Wt Readings from Last 3 Encounters:  09/20/23 225 lb 8 oz (102.3 kg)  09/19/23 230 lb (104.3 kg)  07/28/23 249 lb 1.9 oz (113 kg)    GEN: Well nourished, well developed in no acute distress NECK: No JVD; No carotid bruits CARDIAC: RRR, tachycardic, no murmurs, rubs, gallops RESPIRATORY:  Clear to auscultation without rales, wheezing or rhonchi  ABDOMEN: Soft, non-tender, non-distended EXTREMITIES:  No edema; No deformity   ASSESSMENT AND PLAN Dysautonomia/POTS with worsening symptoms.  Symptoms are tachycardia, labile blood pressures, shortness of breath, atypical chest pain, palpitations, changes in weight. previously identified by her pediatric cardiologist.  It was associated with stimulant use for ADD. Stimulant this time for resurfacing of symptoms is treatment for endometriosis.  She has been proactive with continuing to maintain adequate  hydration salt loading and compression therapy.  She continues to remain extremely symptomatic and has been to the emergency department she states 4 times in the last couple of months.  Previously she did not tolerate several medications as far as beta-blocker therapy and salt tablets.  She is scheduled for an updated echocardiogram while we are waiting to get her referral to [facility] POTS clinic.  If extended wait list to [facility] will try [facility] POTS clinic. If no luck with referral will send to EP. She has been encouraged to continue with compression, hydration, salt, and activity as tolerated safely and likely recumbent bike and things of that sort.  She states that she has noticed a noted weight loss which she says of 30 pounds in the last 4 months which can be a trigger as well.  Labs in the emergency department were reassuring.  There was some concern for blood clots as her sister 62 years old had a blood clot.  D-dimer in the emergency department was noted to be negative.  Sinus tachycardia noted on EKG today.  Longstanding history of tachycardia with intolerance to beta-blocker therapy.  A component of dysautonomia.  Will continue to monitor with surveillance studies.  Atypical chest pain with mild imbalance.  High-sensitivity troponins were negative in the emergency department.  No ischemic changes noted on EKG.  No further ischemic testing needed at this time.       Dispo: Patient return to clinic to see MD/APP on an as needed basis or sooner if abnormalities are noted on echocardiogram.  If she is unable to get in with a POTS clinic will likely need to be followed up by EP provider.  Signed, Xaivier Malay, NP

## 2023-09-20 NOTE — Addendum Note (Signed)
 Addended by: Comfort Iversen D on: 09/20/2023 02:50 PM   Modules accepted: Orders

## 2023-09-20 NOTE — Patient Instructions (Signed)
 Referral to POTS clinic at [facility]  Medication Instructions:  Your physician recommends that you continue on your current medications as directed. Please refer to the Current Medication list given to you today.   *If you need a refill on your cardiac medications before your next appointment, please call your pharmacy*  Lab Work: No labs ordered today  If you have labs (blood work) drawn today and your tests are completely normal, you will receive your results only by: MyChart Message (if you have MyChart) OR A paper copy in the mail If you have any lab test that is abnormal or we need to change your treatment, we will call you to review the results.  Testing/Procedures: Your physician has requested that you have an echocardiogram. Echocardiography is a painless test that uses sound waves to create images of your heart. It provides your doctor with information about the size and shape of your heart and how well your heart's chambers and valves are working.   You may receive an ultrasound enhancing agent through an IV if needed to better visualize your heart during the echo. This procedure takes approximately one hour.  There are no restrictions for this procedure.  This will take place at [address] [facility]) [address], [place] [address]  Please note: We ask at that you not bring children with you during ultrasound (echo/ vascular) testing. Due to room size and safety concerns, children are not allowed in the ultrasound rooms during exams. Our front office staff cannot provide observation of children in our lobby area while testing is being conducted. An adult accompanying a patient to their appointment will only be allowed in the ultrasound room at the discretion of the ultrasound technician under special circumstances. We apologize for any inconvenience.   Follow-Up: At [facility], you and your health needs are our priority.  As part of our continuing mission to  provide you with exceptional heart care, our providers are all part of one team.  This team includes your primary Cardiologist (physician) and Advanced Practice Providers or APPs (Physician Assistants and Nurse Practitioners) who all work together to provide you with the care you need, when you need it.  Your next appointment:   As needed  Provider:   Tylene Lunch, NP

## 2023-09-21 DIAGNOSIS — G90A Postural orthostatic tachycardia syndrome (POTS): Secondary | ICD-10-CM

## 2023-09-21 NOTE — Telephone Encounter (Signed)
 We sent a referral to [facility] POTS clinic and to Dr. VivianGLENWOOD Barefoot at [facility] who does the [facility] POTS clinic. We can send a referral to Dr Cheron Harbor but he usually specializes in heart failure and research. If he is taking new patients and will accept that is perfectly fine. We just want to make sure you get what you need.

## 2023-09-29 ENCOUNTER — Emergency Department

## 2023-09-29 ENCOUNTER — Other Ambulatory Visit: Payer: Self-pay

## 2023-09-29 ENCOUNTER — Emergency Department: Admission: EM | Admit: 2023-09-29 | Discharge: 2023-09-30 | Disposition: A | Discharge status: Home / Self Care

## 2023-09-29 DIAGNOSIS — H6692 Otitis media, unspecified, left ear: Secondary | ICD-10-CM | POA: Diagnosis not present

## 2023-09-29 DIAGNOSIS — G90A Postural orthostatic tachycardia syndrome (POTS): Secondary | ICD-10-CM | POA: Diagnosis not present

## 2023-09-29 DIAGNOSIS — R059 Cough, unspecified: Secondary | ICD-10-CM | POA: Insufficient documentation

## 2023-09-29 DIAGNOSIS — H9202 Otalgia, left ear: Secondary | ICD-10-CM | POA: Diagnosis present

## 2023-09-29 LAB — CBC WITH DIFFERENTIAL/PLATELET
Abs Immature Granulocytes: 0.04 K/uL (ref 0.00–0.07)
Basophils Absolute: 0 K/uL (ref 0.0–0.1)
Basophils Relative: 0 %
Eosinophils Absolute: 0 K/uL (ref 0.0–0.5)
Eosinophils Relative: 0 %
HCT: 42.6 % (ref 36.0–46.0)
Hemoglobin: 13.1 g/dL (ref 12.0–15.0)
Immature Granulocytes: 0 %
Lymphocytes Relative: 21 %
Lymphs Abs: 2.4 K/uL (ref 0.7–4.0)
MCH: 25 pg — ABNORMAL LOW (ref 26.0–34.0)
MCHC: 30.8 g/dL (ref 30.0–36.0)
MCV: 81.5 fL (ref 80.0–100.0)
Monocytes Absolute: 0.7 K/uL (ref 0.1–1.0)
Monocytes Relative: 6 %
Neutro Abs: 8.2 K/uL — ABNORMAL HIGH (ref 1.7–7.7)
Neutrophils Relative %: 73 %
Platelets: 324 K/uL (ref 150–400)
RBC: 5.23 MIL/uL — ABNORMAL HIGH (ref 3.87–5.11)
RDW: 13.6 % (ref 11.5–15.5)
WBC: 11.4 K/uL — ABNORMAL HIGH (ref 4.0–10.5)
nRBC: 0 % (ref 0.0–0.2)

## 2023-09-29 LAB — URINALYSIS, COMPLETE (UACMP) WITH MICROSCOPIC
Bilirubin Urine: NEGATIVE
Glucose, UA: NEGATIVE mg/dL
Hgb urine dipstick: NEGATIVE
Ketones, ur: NEGATIVE mg/dL
Nitrite: NEGATIVE
Protein, ur: NEGATIVE mg/dL
Specific Gravity, Urine: 1.009 (ref 1.005–1.030)
pH: 6 (ref 5.0–8.0)

## 2023-09-29 LAB — BASIC METABOLIC PANEL WITH GFR
Anion gap: 11 (ref 5–15)
BUN: 9 mg/dL (ref 6–20)
CO2: 23 mmol/L (ref 22–32)
Calcium: 8.8 mg/dL — ABNORMAL LOW (ref 8.9–10.3)
Chloride: 103 mmol/L (ref 98–111)
Creatinine, Ser: 1.06 mg/dL — ABNORMAL HIGH (ref 0.44–1.00)
GFR, Estimated: >60 mL/min (ref >60)
Glucose, Bld: 120 mg/dL — ABNORMAL HIGH (ref 70–99)
Potassium: 3.8 mmol/L (ref 3.5–5.1)
Sodium: 137 mmol/L (ref 135–145)

## 2023-09-29 LAB — HCG, QUANTITATIVE, PREGNANCY: hCG, Beta Chain, Quant, S: <1 m[IU]/mL (ref <5)

## 2023-09-29 LAB — RESP PANEL BY RT-PCR (RSV, FLU A&B, COVID)  RVPGX2
Influenza A by PCR: NEGATIVE
Influenza B by PCR: NEGATIVE
Resp Syncytial Virus by PCR: NEGATIVE
SARS Coronavirus 2 by RT PCR: NEGATIVE

## 2023-09-29 MED ORDER — AMOXICILLIN 500 MG PO CAPS
500.0000 mg | ORAL_CAPSULE | Freq: Once | ORAL | Status: AC
  Administered 2023-09-29: 500 mg via ORAL
  Filled 2023-09-29: qty 1

## 2023-09-29 MED ORDER — LACTATED RINGERS IV BOLUS
1000.0000 mL | Freq: Once | INTRAVENOUS | Status: AC
  Administered 2023-09-29: 1000 mL via INTRAVENOUS

## 2023-09-29 MED ORDER — KETOROLAC TROMETHAMINE 15 MG/ML IJ SOLN
15.0000 mg | Freq: Once | INTRAMUSCULAR | Status: AC
  Administered 2023-09-29: 15 mg via INTRAVENOUS
  Filled 2023-09-29: qty 1

## 2023-09-29 MED ORDER — ACETAMINOPHEN 500 MG PO TABS
1000.0000 mg | ORAL_TABLET | Freq: Once | ORAL | Status: AC
  Administered 2023-09-29: 1000 mg via ORAL
  Filled 2023-09-29: qty 2

## 2023-09-29 MED ORDER — AMOXICILLIN 500 MG PO CAPS: 500.0000 mg | ORAL_CAPSULE | Freq: Three times a day (TID) | ORAL | 0 refills | Status: AC

## 2023-09-29 NOTE — ED Triage Notes (Signed)
 Pt states that she has POTS and has been feeling like she was going to pass out and has had tachycardia x past couple of months with worsening x 2-3 days. Pt also reports cold symptoms that began 2 days ago, as well.

## 2023-09-29 NOTE — ED Provider Notes (Signed)
 [facility] Provider Note    Event Date/Time   First MD Initiated Contact with Patient 09/29/23 2119     (approximate)   History   Tachycardia   HPI  Cassandra Ramos is a 23 y.o. female with POTS who presents with 2 to 3 days of cough, congestion and left ear pain now with worsening tachycardia and lightheadedness as if she would pass out.  Patient denies any chest pain shortness of breath abdominal pain changes in urinary or bowel habits.  She states that she continuously has a chronic UTI but denies any dysuria.  Denies any SI HI or AVH S.  Presents with her boyfriend who contributes to the history      Physical Exam   Triage Vital Signs: ED Triage Vitals  Encounter Vitals Group     BP 09/29/23 2103 (!) 138/98     Girls Systolic BP Percentile --      Girls Diastolic BP Percentile --      Boys Systolic BP Percentile --      Boys Diastolic BP Percentile --      Pulse Rate 09/29/23 2103 (!) 147     Resp 09/29/23 2103 20     Temp 09/29/23 2103 (!) 100.6 F (38.1 C)     Temp Source 09/29/23 2103 Oral     SpO2 09/29/23 2103 100 %     Weight --      Height --      Head Circumference --      Peak Flow --      Pain Score 09/29/23 2102 5     Pain Loc --      Pain Education --      Exclude from Growth Chart --     Most recent vital signs: Vitals:   09/29/23 2252 09/29/23 2345  BP:  127/88  Pulse:  90  Resp:  18  Temp: 98.2 F (36.8 C)   SpO2:  99%    Nursing Triage Note reviewed. Vital signs reviewed and patients oxygen saturation is normoxic  General: Patient is well nourished, well developed, awake and alert, resting comfortably in no acute distress Head: Normocephalic and atraumatic Eyes: Normal inspection, extraocular muscles intact, no conjunctival pallor Ear, nose, throat: Normal external exam Left ear TM erythematous and bulging, right ear within normal limits Neck: Normal range of motion Respiratory: Patient is in no respiratory  distress, lungs CTAB Cardiovascular: Patient is tachycardic, RR without murmur appreciated GI: Abd SNT with no guarding or rebound  Back: Normal inspection of the back with good strength and range of motion throughout all ext Extremities: pulses intact with good cap refills, no LE pitting edema or calf tenderness Neuro: The patient is alert and oriented to person, place, and time, appropriately conversive, with 5/5 bilat UE/LE strength, no gross motor or sensory defects noted. Coordination appears to be adequate. Skin: Warm, dry, and intact Psych: normal mood and affect, no SI or HI  ED Results / Procedures / Treatments   Labs (all labs ordered are listed, but only abnormal results are displayed) Labs Reviewed  CBC WITH DIFFERENTIAL/PLATELET - Abnormal; Notable for the following components:      Result Value   WBC 11.4 (*)    RBC 5.23 (*)    MCH 25.0 (*)    Neutro Abs 8.2 (*)    All other components within normal limits  BASIC METABOLIC PANEL WITH GFR - Abnormal; Notable for the following components:   Glucose, Bld  120 (*)    Creatinine, Ser 1.06 (*)    Calcium 8.8 (*)    All other components within normal limits  URINALYSIS, COMPLETE (UACMP) WITH MICROSCOPIC - Abnormal; Notable for the following components:   Color, Urine YELLOW (*)    APPearance HAZY (*)    Leukocytes,Ua TRACE (*)    Bacteria, UA FEW (*)    All other components within normal limits  RESP PANEL BY RT-PCR (RSV, FLU A&B, COVID)  RVPGX2  CULTURE, BLOOD (ROUTINE X 2)  CULTURE, BLOOD (ROUTINE X 2)  HCG, QUANTITATIVE, PREGNANCY     EKG EKG and rhythm strip are interpreted by myself:   EKG: [tachycardic sinus rhythm] at heart rate of 141, normal QRS duration, QTc 413, nonspecific ST segments and T waves no ectopy EKG not consistent with Acute STEMI Rhythm strip: Tachycardic sinus rhythm in lead II   RADIOLOGY XR chest: No acute abnormality on my independent review interpretation and radiologist  agrees    PROCEDURES:  Critical Care performed: No  Procedures   MEDICATIONS ORDERED IN ED: Medications  lactated ringers  bolus 1,000 mL (1,000 mLs Intravenous New Bag/Given 09/29/23 2143)  lactated ringers  bolus 1,000 mL (1,000 mLs Intravenous New Bag/Given 09/29/23 2143)  acetaminophen  (TYLENOL ) tablet 1,000 mg (1,000 mg Oral Given 09/29/23 2144)  ketorolac  (TORADOL ) 15 MG/ML injection 15 mg (15 mg Intravenous Given 09/29/23 2246)  amoxicillin  (AMOXIL ) capsule 500 mg (500 mg Oral Given 09/29/23 2338)     IMPRESSION / MDM / ASSESSMENT AND PLAN / ED COURSE                                Differential diagnosis includes, but is not limited to, POTS, URI, pregnancy, sepsis, acute otitis media, electrolyte derangement  ED course: Patient arrives tachycardic and febrile however tachycardia can be secondary to POTS decision made not to provide broad-spectrum antibiotics initially.  Patient does have a source of her infection.  X-ray of the chest revealed no acute abnormality.  Patient's heart rate improved with IV fluids (she received 2 L) and fever down trended with Toradol  and Tylenol .  Patient feels much improved and feels comfortable returning home.  I will send with a prescription for amoxicillin  for left otitis media.  She will alternate between over-the-counter Tylenol  and ibuprofen around-the-clock for the next 48 hours and follow-up with her primary care physician   Clinical Course as of 09/29/23 2358  Sat Sep 29, 2023  2253 Patient reexamined and heart rate and temperature is normalized.  She states that she has been experiencing left ear pain and upon review she does have a left-sided AOM.  Will start the patient on amoxicillin  [HD]    Clinical Course User Index [HD] Nicholaus Rolland BRAVO, MD   At time of discharge there is no evidence of acute life, limb, vision, or fertility threat. Patient has stable vital signs, pain is well controlled, patient is ambulatory and p.o.  tolerant.  Discharge instructions were completed using the [employer] system. I would refer you to those at this time. All warnings prescriptions follow-up etc. were discussed in detail with the patient. Patient indicates understanding and is agreeable with this plan. All questions answered.  Patient is made aware that they may return to the emergency department for any worsening or new condition or for any other emergency.   Risk: 5 This patient has a high risk of morbidity due to further diagnostic testing or treatment. Rationale:  This patient's evaluation and management involve a high risk of morbidity due to the potential severity of presenting symptoms, need for diagnostic testing, and/or initiation of treatment that may require close monitoring. The differential includes conditions with potential for significant deterioration or requiring escalation of care. Treatment decisions in the ED, including medication administration, procedural interventions, or disposition planning, reflect this level of risk. Additional Support: -- Drug therapy requiring intensive monitoring for toxicity [ ]  -- Decision regarding elective major surgery with idenitified patient or procedure risk factors [ ]  -- Decision regarding hospitalization or escalation of hospital-level care [ ]  -- Decision not to resuscitate or to de-escalate care because of poor prognosis [ ]  -- Parental controlled substances [ ]   COPA: 5 The patient has a severe exacerbation, progression, or side effect of treatment of the following illness/illnesses: []  OR  The patient has the following acute or chronic illness/injury that poses a possible threat to life or bodily function: [X] : The patient has a potentially serious acute condition or an acute exacerbation of a chronic illness requiring urgent evaluation and management in the Emergency Department. The clinical presentation necessitates immediate consideration of life-threatening or  function-threatening diagnoses, even if they are ultimately ruled out.  Data(2/3 categories following were performed): 5 I reviewed or ordered at least three unique tests, external notes, and/or the history required an independent historian as one of the three requirements as following: CBC, BMP, COVID, influenza, boyfriend AND  I independently interpreted the following test: X-ray chest OR  I discussed the management of the patient with the following external physician or qualified healthcare provider: []     Suggested E/M Coding Level: 5, 99285, This has been selected based on the 10-14-2021 CPT guidelines for E/M codes in the Emergency Department based on 2/3 of the CoPA, Data, and Risk.   FINAL CLINICAL IMPRESSION(S) / ED DIAGNOSES   Final diagnoses:  POTS (postural orthostatic tachycardia syndrome)  Left acute otitis media     Rx / DC Orders   ED Discharge Orders          Ordered    amoxicillin  (AMOXIL ) 500 MG capsule  3 times daily        09/29/23 2345-10-14             Note:  This document was prepared using Dragon voice recognition software and may include unintentional dictation errors.   Nicholaus Rolland BRAVO, MD 09/29/23 [phone]

## 2023-09-29 NOTE — Discharge Instructions (Signed)
 Ensure adequate hydration, your next dose of amoxicillin  is due tomorrow.  Alternate between over-the-counter Tylenol  and ibuprofen for the next 48 hours.  Make sure you have food in your stomach.  Please return if any acutely worsening symptoms or any other emergency.  It was very nice meeting you and I wish you the best of luck with everything -- RETURN PRECAUTIONS & AFTERCARE: (ENGLISH) RETURN PRECAUTIONS: Return immediately to the emergency department or see/call your doctor if you feel worse, weak or have changes in speech or vision, are short of breath, have fever, vomiting, pain, bleeding or dark stool, trouble urinating or any new issues. Return here or see/call your doctor if not improving as expected for your suspected condition. FOLLOW-UP CARE: Call your doctor and/or any doctors we referred you to for more advice and to make an appointment. Do this today, tomorrow or after the weekend. Some doctors only take PPO insurance so if you have HMO insurance you may want to contact your HMO or your regular doctor for referral to a specialist within your plan. Either way tell the doctor's office that it was a referral from the emergency department so you get the soonest possible appointment.  YOUR TEST RESULTS: Take result reports of any blood or urine tests, imaging tests and EKG's to your doctor and any referral doctor. Have any abnormal tests repeated. Your doctor or a referral doctor can let you know when this should be done. Also make sure your doctor contacts this hospital to get any test results that are not currently available such as cultures or special tests for infection and final imaging reports, which are often not available at the time you leave the ER but which may list additional important findings that are not documented on the preliminary report. BLOOD PRESSURE: If your blood pressure was greater than 120/80 have your blood pressure rechecked within 1 to 2 weeks. MEDICATION SIDE EFFECTS:  Do not drive, walk, bike, take the bus, etc. if you have received or are being prescribed any sedating medications such as those for pain or anxiety or certain antihistamines like Benadryl . If you have been give one of these here get a taxi home or have a friend drive you home. Ask your pharmacist to counsel you on potential side effects of any new medication

## 2023-10-04 LAB — CULTURE, BLOOD (ROUTINE X 2)
Culture: NO GROWTH
Culture: NO GROWTH

## 2023-10-15 ENCOUNTER — Telehealth: Admitting: Physician Assistant

## 2023-10-15 DIAGNOSIS — R12 Heartburn: Secondary | ICD-10-CM | POA: Diagnosis not present

## 2023-10-15 NOTE — Progress Notes (Signed)
 E-Visit for Heartburn  We are sorry that you are not feeling well.  Here is how we plan to help!  Based on what you shared with me it looks like you most likely have Gastroesophageal Reflux Disease (GERD)  Gastroesophageal reflux disease (GERD) happens when acid from your stomach flows up into the esophagus.  When acid comes in contact with the esophagus, the acid causes sorenss (inflammation) in the esophagus.  Over time, GERD may create small holes (ulcers) in the lining of the esophagus.  I recommend using over the counter Pepcid  20mg  one by mouth twice a day for two weeks.  Your symptoms should improve in the next day or two.  You can use antacids as needed until symptoms resolve.  Call us  if your heartburn worsens, you have trouble swallowing, weight loss, spitting up blood or recurrent vomiting.  A work note has been provided for you today. It will be available under letters in your MyChart account.   Home Care: May include lifestyle changes such as weight loss, quitting smoking and alcohol consumption Avoid foods and drinks that make your symptoms worse, such as: Caffeine or alcoholic drinks Chocolate Peppermint or mint flavorings Garlic and onions Spicy foods Citrus fruits, such as oranges, lemons, or limes Tomato-based foods such as sauce, chili, salsa and pizza Fried and fatty foods Avoid lying down for 3 hours prior to your bedtime or prior to taking a nap Eat small, frequent meals instead of a large meals Wear loose-fitting clothing.  Do not wear anything tight around your waist that causes pressure on your stomach. Raise the head of your bed 6 to 8 inches with wood blocks to help you sleep.  Extra pillows will not help.  Seek Help Right Away If: You have pain in your arms, neck, jaw, teeth or back Your pain increases or changes in intensity or duration You develop nausea, vomiting or sweating (diaphoresis) You develop shortness of breath or you faint Your vomit is  green, yellow, black or looks like coffee grounds or blood Your stool is red, bloody or black  These symptoms could be signs of other problems, such as heart disease, gastric bleeding or esophageal bleeding.  Make sure you : Understand these instructions. Will watch your condition. Will get help right away if you are not doing well or get worse.  Your e-visit answers were reviewed by a board certified advanced clinical practitioner to complete your personal care plan.  Depending on the condition, your plan could have included both over the counter or prescription medications.  If there is a problem please reply  once you have received a response from your provider.  Your safety is important to us .  If you have drug allergies check your prescription carefully.    You can use MyChart to ask questions about today's visit, request a non-urgent call back, or ask for a work or school excuse for 24 hours related to this e-Visit. If it has been greater than 24 hours you will need to follow up with your provider, or enter a new e-Visit to address those concerns.  You will get an e-mail in the next two days asking about your experience.  I hope that your e-visit has been valuable and will speed your recovery. Thank you for using e-visits.   I have spent 5 minutes in review of e-visit questionnaire, review and updating patient chart, medical decision making and response to patient.   Delon CHRISTELLA Dickinson, PA-C

## 2023-10-23 ENCOUNTER — Encounter: Payer: Self-pay | Admitting: Hematology

## 2023-10-25 ENCOUNTER — Ambulatory Visit

## 2023-10-28 ENCOUNTER — Telehealth: Payer: Self-pay | Admitting: Adult Health

## 2023-10-31 ENCOUNTER — Telehealth: Payer: Self-pay | Admitting: Cardiology

## 2023-10-31 NOTE — Telephone Encounter (Signed)
 Called and spoke with patient, states that she goes to [facility] now

## 2023-10-31 NOTE — Telephone Encounter (Signed)
 I called and LMVM for pt to return call about her cymbalta  refill request.

## 2023-11-28 ENCOUNTER — Encounter: Payer: Self-pay | Admitting: Adult Health

## 2023-12-05 ENCOUNTER — Ambulatory Visit: Payer: BC Managed Care – PPO | Admitting: Adult Health

## 2023-12-11 NOTE — Telephone Encounter (Signed)
 With reviewing previous visit with him to get PA recommend continuing ongoing management with [facility] providers.

## 2023-12-27 ENCOUNTER — Telehealth: Payer: Self-pay | Admitting: Cardiology

## 2023-12-27 NOTE — Telephone Encounter (Signed)
 Notes were indexed into the patient's chart from [facility].

## 2024-05-08 ENCOUNTER — Ambulatory Visit: Admitting: Adult Health
